# Patient Record
Sex: Male | Born: 1980
Health system: Southern US, Community
[De-identification: ages and names within clinical notes are randomized; demographics above are authoritative.]

## PROBLEM LIST (undated history)

## (undated) DIAGNOSIS — T7840XA Allergy, unspecified, initial encounter: Secondary | ICD-10-CM

## (undated) HISTORY — DX: Allergy, unspecified, initial encounter: T78.40XA

---

## 2016-11-05 ENCOUNTER — Telehealth: Payer: Self-pay | Admitting: *Deleted

## 2016-11-05 NOTE — Progress Notes (Signed)
Pre visit review using our clinic review tool, if applicable. No additional management support is needed unless otherwise documented below in the visit note. 

## 2016-11-05 NOTE — Progress Notes (Signed)
Joshua Baker is a 36 y.o. male here to Establish care and Physical.  History of Present Illness:   Chief Complaint  Patient presents with  . Establish Care  . Annual Exam    [employer] UMR    Acute Concerns: L hand pain -- a year and half ago, possible soccer injury, but he can't remember; hasn't hurt in a few weeks but is intermittent if pushed the wrong way; he is R handed; denies any ecchymosis, redness or popping/clicking sensation; states that the pain is at the base of his pinky to the top of his wrist  Chronic Issues: Seasonal allergies -- considers them to be well controlled, doesn't like to take something consistently but finds that if he takes 10mg  Claritin daily it prevents sinus infections, has used nasal sprays in the past but without success; he needs a new Epi-Pen prescription  Health Maintenance: Immunizations -- Tdap due today Colonoscopy -- to start at age 31, no family history Diet -- mostly eats at home, wife prepares well-balanced meals but sometimes eats out up to 3 times a week if his schedule requires, eats all food groups Caffeine intake -- 1/2 decaf and 1/2 caf in the morning, occasional sweet tea monthly, no energy drinks or soda Sleep habits -- good sleeper, 7.5 - 8 hours, does snore nightly but no concerns for sleep apnea Exercise -- no regular exercise, but does spend time with the kids in the yard Weight -- Weight: 188 lb (85.3 kg), 178-182 lb  Mood -- depends on the day, stressed at work  Depression screen [facility] 2/9 11/06/2016  Decreased Interest 0  Down, Depressed, Hopeless 0  PHQ - 2 Score 0   Other providers/specialists: Eye exam Dentist -- TBD  PMHx, SurgHx, SocialHx, Medications, and Allergies were reviewed in the Visit Navigator and updated as appropriate.  Current Medications:   Current Outpatient Prescriptions:  .  acetaminophen (TYLENOL) 325 MG tablet, Take 650 mg by mouth as needed., Disp: , Rfl:  .  ibuprofen (ADVIL) 200 MG tablet, Take 400  mg by mouth as needed., Disp: , Rfl:  .  loratadine (CLARITIN) 10 MG tablet, Take 10 mg by mouth daily., Disp: , Rfl:  .  EPINEPHrine (EPIPEN 2-PAK) 0.3 mg/0.3 mL IJ SOAJ injection, Inject 0.3 mLs (0.3 mg total) into the muscle once., Disp: 1 Device, Rfl: 1   Review of Systems:   Review of Systems  Constitutional: Negative.   HENT: Positive for congestion and sore throat.        Seasonal allergies, Sinus  Eyes: Positive for discharge.       Seasonal allergies  Respiratory: Negative.   Cardiovascular: Negative.   Gastrointestinal: Negative.   Genitourinary: Negative.   Musculoskeletal: Positive for joint pain.       Left hand, pinky finger to wrist area, x 1 year  Skin: Negative.   Neurological: Negative.   Endo/Heme/Allergies: Positive for environmental allergies.       Pollen and any stinging insects  Psychiatric/Behavioral: Negative.     Vitals:   Vitals:   11/06/16 0832  BP: 118/80  Pulse: 71  Temp: 98 F (36.7 C)  TempSrc: Oral  SpO2: 96%  Weight: 188 lb (85.3 kg)  Height: 6' (1.829 m)     Body mass index is 25.5 kg/m.  Physical Exam:   Physical Exam  Constitutional: He appears well-developed. He is cooperative.  Non-toxic appearance. He does not have a sickly appearance. He does not appear ill. No distress.  HENT:  Head: Normocephalic and atraumatic.  Right Ear: Tympanic membrane normal. Tympanic membrane is not erythematous, not retracted and not bulging.  Left Ear: Tympanic membrane normal. Tympanic membrane is not erythematous, not retracted and not bulging.  Nose: Nose normal.  Mouth/Throat: Uvula is midline and oropharynx is clear and moist.  Eyes: Conjunctivae, EOM and lids are normal. Pupils are equal, round, and reactive to light.  Neck: Trachea normal.  Cardiovascular: Normal rate, regular rhythm, S1 normal, S2 normal, normal heart sounds and normal pulses.   Pulmonary/Chest: Effort normal and breath sounds normal.  Abdominal: Normal appearance and  bowel sounds are normal. There is no tenderness.  Musculoskeletal:       Left hand: Normal. He exhibits normal range of motion, no tenderness, no bony tenderness, normal capillary refill, no deformity and no laceration. Normal sensation noted. Normal strength noted.  Lymphadenopathy:    He has no cervical adenopathy.  Neurological: He is alert.  Skin: Skin is warm, dry and intact.  Psychiatric: He has a normal mood and affect. His speech is normal and behavior is normal. Thought content normal.  Nursing note and vitals reviewed.     Assessment and Plan:    Shamal was seen today for establish care and annual exam.  Diagnoses and all orders for this visit:  Routine general medical examination at a health care facility Today patient counseled on age appropriate routine health concerns for screening and prevention, each reviewed and up to date or declined. Immunizations reviewed and up to date or declined. Labs ordered and reviewed. Risk factors for depression reviewed and negative. Hearing function and visual acuity are intact. ADLs screened and addressed as needed. Functional ability and level of safety reviewed and appropriate. Education, counseling and referrals performed based on assessed risks today. Patient provided with a copy of personalized plan for preventive services. Patient is agreeable to 1 time HIV screening. -     CBC with Differential/Platelet -     Lipid panel -     Comprehensive metabolic panel -     HIV antibody  Left hand pain Will obtain xray. If pain persists, we will have him see Dr. Teresa Coombs, patient is agreeable to this plan. -     DG Hand Complete Left; Future  Environmental Allergies Continue Claritin daily. Well controlled. Patient does need new prescription for Epi-Pen. Will order for patient. -     EPINEPHrine (EPIPEN 2-PAK) 0.3 mg/0.3 mL IJ SOAJ injection; Inject 0.3 mLs (0.3 mg total) into the muscle once.  . Reviewed expectations re: course of  current medical issues. . Discussed self-management of symptoms. . Outlined signs and symptoms indicating need for more acute intervention. . Patient verbalized understanding and all questions were answered. . See orders for this visit as documented in the electronic medical record. . Patient received an After-Visit Summary.   Inda Coke, PA-C

## 2016-11-05 NOTE — Telephone Encounter (Signed)
PreVisit Call attempted. Left VM. 

## 2016-11-06 ENCOUNTER — Encounter: Payer: Self-pay | Admitting: Physician Assistant

## 2016-11-06 ENCOUNTER — Ambulatory Visit (INDEPENDENT_AMBULATORY_CARE_PROVIDER_SITE_OTHER): Payer: Commercial Managed Care - PPO | Admitting: Physician Assistant

## 2016-11-06 ENCOUNTER — Ambulatory Visit (INDEPENDENT_AMBULATORY_CARE_PROVIDER_SITE_OTHER): Payer: Commercial Managed Care - PPO

## 2016-11-06 VITALS — BP 118/80 | HR 71 | Temp 98.0°F | Ht 72.0 in | Wt 188.0 lb

## 2016-11-06 DIAGNOSIS — Z23 Encounter for immunization: Secondary | ICD-10-CM

## 2016-11-06 DIAGNOSIS — Z9109 Other allergy status, other than to drugs and biological substances: Secondary | ICD-10-CM | POA: Diagnosis not present

## 2016-11-06 DIAGNOSIS — R51 Headache: Secondary | ICD-10-CM | POA: Diagnosis not present

## 2016-11-06 DIAGNOSIS — M79642 Pain in left hand: Secondary | ICD-10-CM | POA: Diagnosis not present

## 2016-11-06 DIAGNOSIS — Z0000 Encounter for general adult medical examination without abnormal findings: Secondary | ICD-10-CM | POA: Diagnosis not present

## 2016-11-06 LAB — CBC WITH DIFFERENTIAL/PLATELET
Basophils Absolute: 0.1 10*3/uL (ref 0.0–0.1)
Basophils Relative: 1 % (ref 0.0–3.0)
Eosinophils Absolute: 0.3 10*3/uL (ref 0.0–0.7)
Eosinophils Relative: 4.5 % (ref 0.0–5.0)
HCT: 46 % (ref 39.0–52.0)
Hemoglobin: 15.4 g/dL (ref 13.0–17.0)
LYMPHS ABS: 2.3 10*3/uL (ref 0.7–4.0)
Lymphocytes Relative: 30.7 % (ref 12.0–46.0)
MCHC: 33.4 g/dL (ref 30.0–36.0)
MCV: 83.9 fl (ref 78.0–100.0)
MONO ABS: 0.7 10*3/uL (ref 0.1–1.0)
Monocytes Relative: 8.9 % (ref 3.0–12.0)
NEUTROS PCT: 54.9 % (ref 43.0–77.0)
Neutro Abs: 4.2 10*3/uL (ref 1.4–7.7)
PLATELETS: 310 10*3/uL (ref 150.0–400.0)
RBC: 5.49 Mil/uL (ref 4.22–5.81)
RDW: 14 % (ref 11.5–15.5)
WBC: 7.6 10*3/uL (ref 4.0–10.5)

## 2016-11-06 LAB — COMPREHENSIVE METABOLIC PANEL
ALBUMIN: 4.5 g/dL (ref 3.5–5.2)
ALK PHOS: 51 U/L (ref 39–117)
ALT: 21 U/L (ref 0–53)
AST: 22 U/L (ref 0–37)
BILIRUBIN TOTAL: 0.5 mg/dL (ref 0.2–1.2)
BUN: 13 mg/dL (ref 6–23)
CO2: 31 mEq/L (ref 19–32)
CREATININE: 0.94 mg/dL (ref 0.40–1.50)
Calcium: 9.7 mg/dL (ref 8.4–10.5)
Chloride: 103 mEq/L (ref 96–112)
GFR: 96.67 mL/min (ref >60.00)
Glucose, Bld: 86 mg/dL (ref 70–99)
POTASSIUM: 4.1 meq/L (ref 3.5–5.1)
SODIUM: 139 meq/L (ref 135–145)
TOTAL PROTEIN: 7.7 g/dL (ref 6.0–8.3)

## 2016-11-06 LAB — LIPID PANEL
Cholesterol: 166 mg/dL (ref 0–200)
HDL: 41.8 mg/dL (ref >39.00)
LDL Cholesterol: 107 mg/dL — ABNORMAL HIGH (ref 0–99)
NonHDL: 123.87
Total CHOL/HDL Ratio: 4
Triglycerides: 83 mg/dL (ref 0.0–149.0)
VLDL: 16.6 mg/dL (ref 0.0–40.0)

## 2016-11-06 MED ORDER — EPINEPHRINE 0.3 MG/0.3ML IJ SOAJ: 0.3000 mg | Freq: Once | INTRAMUSCULAR | 1 refills | Status: AC

## 2016-11-06 NOTE — Patient Instructions (Addendum)
It was great meeting you today!  We will call you with your lab and xray results.   Health Maintenance, Male A healthy lifestyle and preventive care is important for your health and wellness. Ask your health care provider about what schedule of regular examinations is right for you. What should I know about weight and diet?  Eat a Healthy Diet  Eat plenty of vegetables, fruits, whole grains, low-fat dairy products, and lean protein.  Do not eat a lot of foods high in solid fats, added sugars, or salt. Maintain a Healthy Weight  Regular exercise can help you achieve or maintain a healthy weight. You should:  Do at least 150 minutes of exercise each week. The exercise should increase your heart rate and make you sweat (moderate-intensity exercise).  Do strength-training exercises at least twice a week. Watch Your Levels of Cholesterol and Blood Lipids  Have your blood tested for lipids and cholesterol every 5 years starting at 36 years of age. If you are at high risk for heart disease, you should start having your blood tested when you are 36 years old. You may need to have your cholesterol levels checked more often if:  Your lipid or cholesterol levels are high.  You are older than 36 years of age.  You are at high risk for heart disease. What should I know about cancer screening? Many types of cancers can be detected early and may often be prevented. Lung Cancer  You should be screened every year for lung cancer if:  You are a current smoker who has smoked for at least 30 years.  You are a former smoker who has quit within the past 15 years.  Talk to your health care provider about your screening options, when you should start screening, and how often you should be screened. Colorectal Cancer  Routine colorectal cancer screening usually begins at 36 years of age and should be repeated every 5-10 years until you are 36 years old. You may need to be screened more often if early  forms of precancerous polyps or small growths are found. Your health care provider may recommend screening at an earlier age if you have risk factors for colon cancer.  Your health care provider may recommend using home test kits to check for hidden blood in the stool.  A small camera at the end of a tube can be used to examine your colon (sigmoidoscopy or colonoscopy). This checks for the earliest forms of colorectal cancer. Prostate and Testicular Cancer  Depending on your age and overall health, your health care provider may do certain tests to screen for prostate and testicular cancer.  Talk to your health care provider about any symptoms or concerns you have about testicular or prostate cancer. Skin Cancer  Check your skin from head to toe regularly.  Tell your health care provider about any new moles or changes in moles, especially if:  There is a change in a mole's size, shape, or color.  You have a mole that is larger than a pencil eraser.  Always use sunscreen. Apply sunscreen liberally and repeat throughout the day.  Protect yourself by wearing long sleeves, pants, a wide-brimmed hat, and sunglasses when outside. What should I know about heart disease, diabetes, and high blood pressure?  If you are 64-50 years of age, have your blood pressure checked every 3-5 years. If you are 52 years of age or older, have your blood pressure checked every year. You should have your blood  pressure measured twice-once when you are at a hospital or clinic, and once when you are not at a hospital or clinic. Record the average of the two measurements. To check your blood pressure when you are not at a hospital or clinic, you can use:  An automated blood pressure machine at a pharmacy.  A home blood pressure monitor.  Talk to your health care provider about your target blood pressure.  If you are between 56-30 years old, ask your health care provider if you should take aspirin to prevent heart  disease.  Have regular diabetes screenings by checking your fasting blood sugar level.  If you are at a normal weight and have a low risk for diabetes, have this test once every three years after the age of 78.  If you are overweight and have a high risk for diabetes, consider being tested at a younger age or more often.  A one-time screening for abdominal aortic aneurysm (AAA) by ultrasound is recommended for men aged 54-75 years who are current or former smokers. What should I know about preventing infection? Hepatitis B  If you have a higher risk for hepatitis B, you should be screened for this virus. Talk with your health care provider to find out if you are at risk for hepatitis B infection. Hepatitis C  Blood testing is recommended for:  Everyone born from [birth year] through 1965.  Anyone with known risk factors for hepatitis C. Sexually Transmitted Diseases (STDs)  You should be screened each year for STDs including gonorrhea and chlamydia if:  You are sexually active and are younger than 36 years of age.  You are older than 36 years of age and your health care provider tells you that you are at risk for this type of infection.  Your sexual activity has changed since you were last screened and you are at an increased risk for chlamydia or gonorrhea. Ask your health care provider if you are at risk.  Talk with your health care provider about whether you are at high risk of being infected with HIV. Your health care provider may recommend a prescription medicine to help prevent HIV infection. What else can I do?  Schedule regular health, dental, and eye exams.  Stay current with your vaccines (immunizations).  Do not use any tobacco products, such as cigarettes, chewing tobacco, and e-cigarettes. If you need help quitting, ask your health care provider.  Limit alcohol intake to no more than 2 drinks per day. One drink equals 12 ounces of beer, 5 ounces of wine, or 1 ounces of hard  liquor.  Do not use street drugs.  Do not share needles.  Ask your health care provider for help if you need support or information about quitting drugs.  Tell your health care provider if you often feel depressed.  Tell your health care provider if you have ever been abused or do not feel safe at home. This information is not intended to replace advice given to you by your health care provider. Make sure you discuss any questions you have with your health care provider. Document Released: 01/19/2008 Document Revised: 03/21/2016 Document Reviewed: 04/26/2015 Elsevier Interactive Patient Education  2017 [employer].

## 2016-11-07 LAB — HIV ANTIBODY (ROUTINE TESTING W REFLEX): HIV: NONREACTIVE

## 2017-11-15 IMAGING — DX DG HAND COMPLETE 3+V*L*
3 series · 3 of 3 positions shown · non-contrast
Comparison: None in PACs

CLINICAL DATA: Lateral head pain for the past 18 months without
known injury.

EXAM:
LEFT HAND - COMPLETE 3+ VIEW

[hand pa]
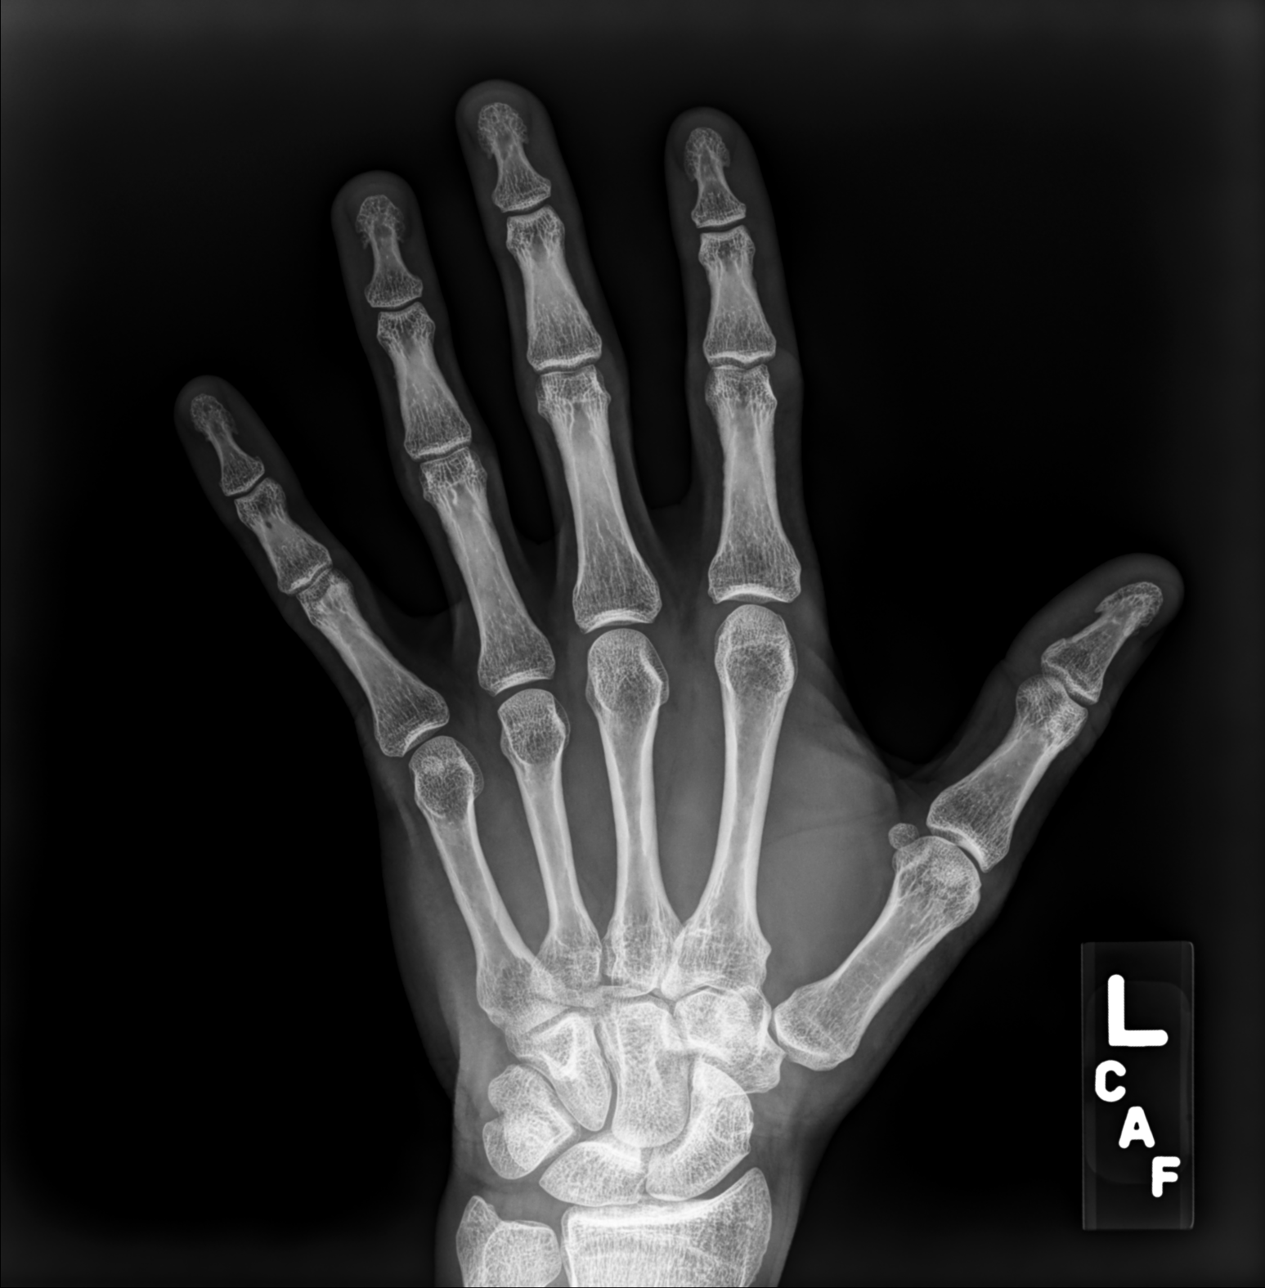

[hand oblique]
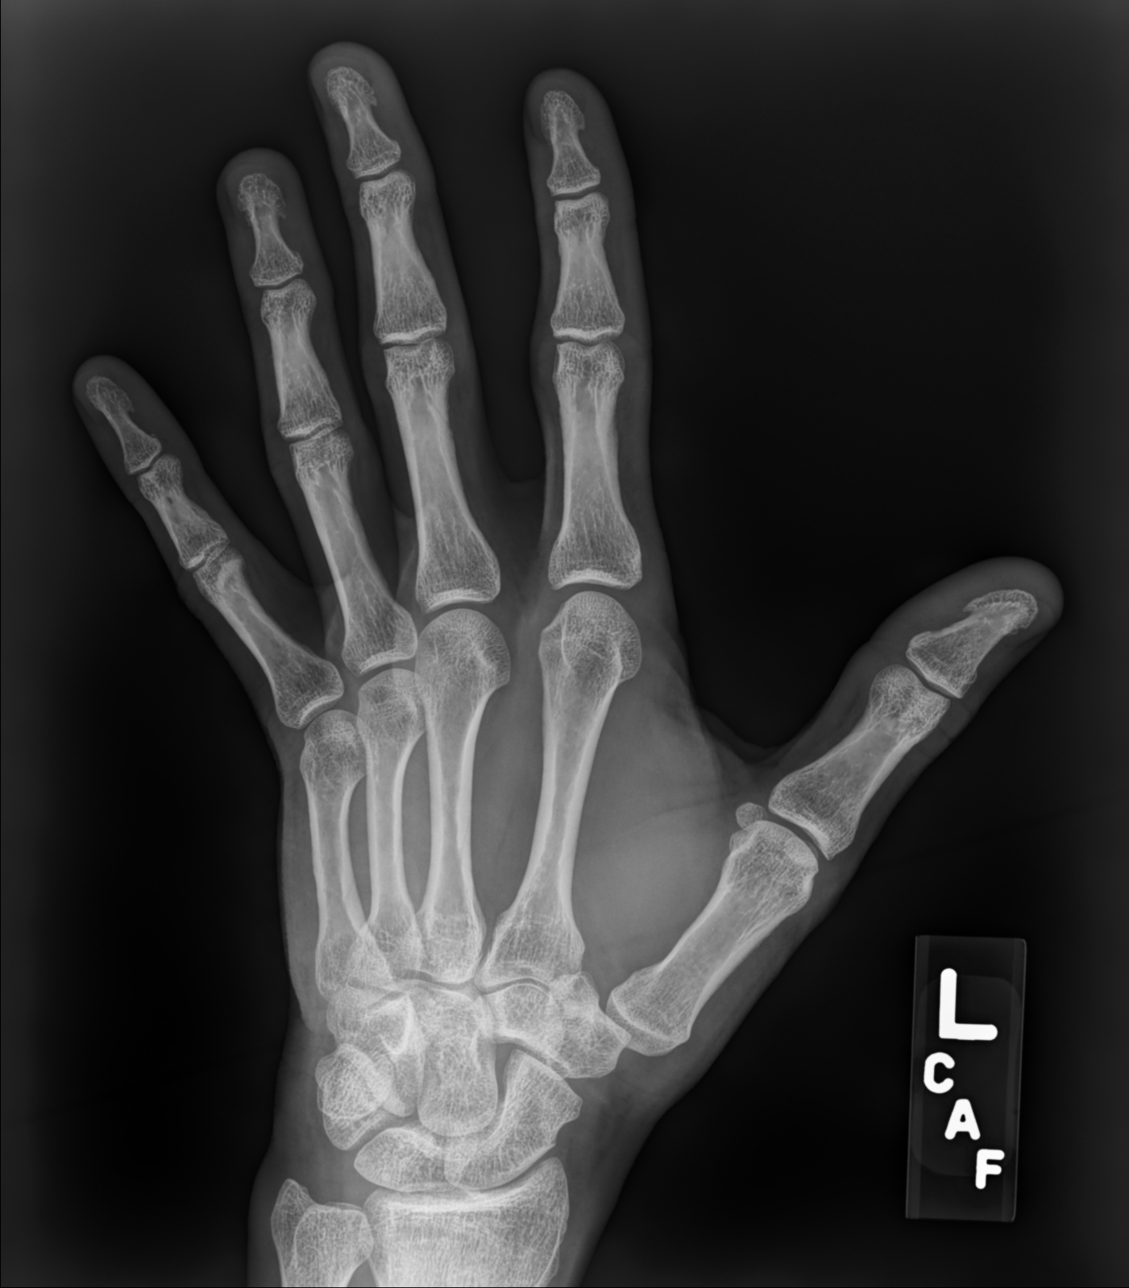

[hand lat]
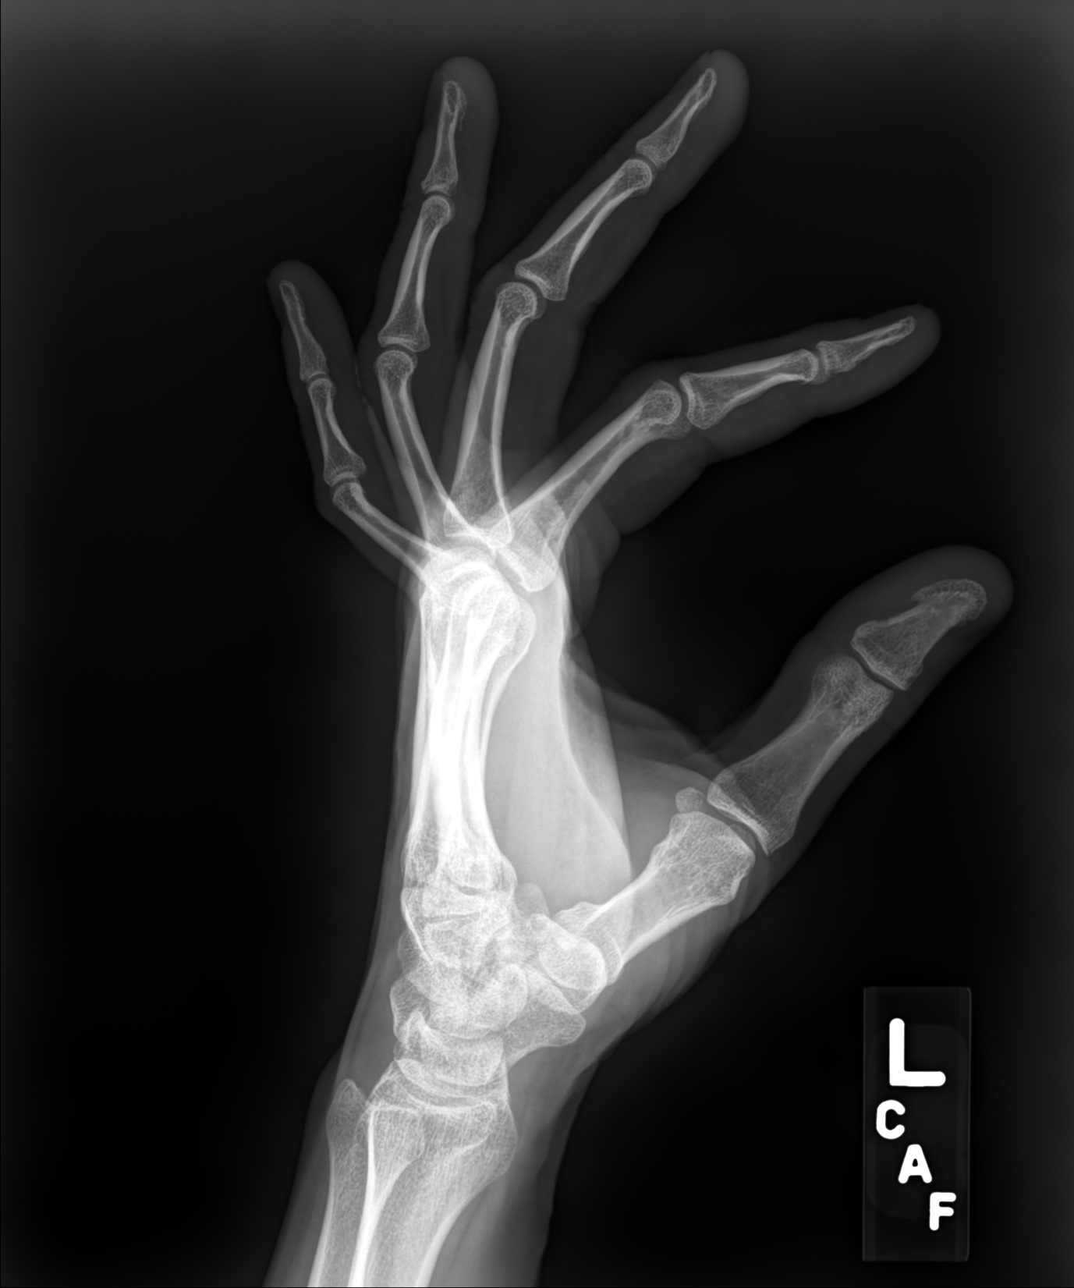

[3 of 3 positions shown; findings below may reference images not displayed]

FINDINGS: The bones of the left hand are subjectively adequately mineralized.
There is no lytic or blastic lesion. There is no acute or healing
fracture. The joint spaces are well maintained. The soft tissues are
unremarkable.
IMPRESSION: There is no acute or significant chronic bony abnormality of the
left hand.

## 2018-02-11 ENCOUNTER — Encounter: Payer: Self-pay | Admitting: Physician Assistant

## 2018-02-11 ENCOUNTER — Ambulatory Visit (INDEPENDENT_AMBULATORY_CARE_PROVIDER_SITE_OTHER): Payer: Commercial Managed Care - PPO | Admitting: Physician Assistant

## 2018-02-11 VITALS — BP 118/80 | HR 72 | Temp 98.5°F | Ht 72.0 in | Wt 193.0 lb

## 2018-02-11 DIAGNOSIS — Z9109 Other allergy status, other than to drugs and biological substances: Secondary | ICD-10-CM | POA: Diagnosis not present

## 2018-02-11 DIAGNOSIS — Z0000 Encounter for general adult medical examination without abnormal findings: Secondary | ICD-10-CM | POA: Diagnosis not present

## 2018-02-11 DIAGNOSIS — E663 Overweight: Secondary | ICD-10-CM | POA: Diagnosis not present

## 2018-02-11 DIAGNOSIS — Z13220 Encounter for screening for lipoid disorders: Secondary | ICD-10-CM | POA: Diagnosis not present

## 2018-02-11 DIAGNOSIS — Z136 Encounter for screening for cardiovascular disorders: Secondary | ICD-10-CM | POA: Diagnosis not present

## 2018-02-11 LAB — LIPID PANEL
CHOL/HDL RATIO: 4
Cholesterol: 176 mg/dL (ref 0–200)
HDL: 39.1 mg/dL (ref >39.00)
LDL CALC: 117 mg/dL — AB (ref 0–99)
NONHDL: 136.83
Triglycerides: 99 mg/dL (ref 0.0–149.0)
VLDL: 19.8 mg/dL (ref 0.0–40.0)

## 2018-02-11 LAB — COMPREHENSIVE METABOLIC PANEL
ALT: 25 U/L (ref 0–53)
AST: 19 U/L (ref 0–37)
Albumin: 4.2 g/dL (ref 3.5–5.2)
Alkaline Phosphatase: 50 U/L (ref 39–117)
BILIRUBIN TOTAL: 0.6 mg/dL (ref 0.2–1.2)
BUN: 13 mg/dL (ref 6–23)
CO2: 28 meq/L (ref 19–32)
CREATININE: 0.94 mg/dL (ref 0.40–1.50)
Calcium: 9.3 mg/dL (ref 8.4–10.5)
Chloride: 103 mEq/L (ref 96–112)
GFR: 95.99 mL/min (ref >60.00)
GLUCOSE: 92 mg/dL (ref 70–99)
Potassium: 4.2 mEq/L (ref 3.5–5.1)
Sodium: 137 mEq/L (ref 135–145)
TOTAL PROTEIN: 7 g/dL (ref 6.0–8.3)

## 2018-02-11 LAB — CBC WITH DIFFERENTIAL/PLATELET
BASOS PCT: 1 % (ref 0.0–3.0)
Basophils Absolute: 0.1 10*3/uL (ref 0.0–0.1)
EOS PCT: 2.9 % (ref 0.0–5.0)
Eosinophils Absolute: 0.2 10*3/uL (ref 0.0–0.7)
HCT: 45.8 % (ref 39.0–52.0)
Hemoglobin: 15.3 g/dL (ref 13.0–17.0)
Lymphocytes Relative: 27.2 % (ref 12.0–46.0)
Lymphs Abs: 2.2 10*3/uL (ref 0.7–4.0)
MCHC: 33.4 g/dL (ref 30.0–36.0)
MCV: 83.4 fl (ref 78.0–100.0)
MONOS PCT: 10.4 % (ref 3.0–12.0)
Monocytes Absolute: 0.8 10*3/uL (ref 0.1–1.0)
NEUTROS ABS: 4.7 10*3/uL (ref 1.4–7.7)
Neutrophils Relative %: 58.5 % (ref 43.0–77.0)
Platelets: 311 10*3/uL (ref 150.0–400.0)
RBC: 5.49 Mil/uL (ref 4.22–5.81)
RDW: 13.6 % (ref 11.5–15.5)
WBC: 8 10*3/uL (ref 4.0–10.5)

## 2018-02-11 NOTE — Patient Instructions (Addendum)
It was great to meet you!  We will contact you with your lab results.   Health Maintenance, Male A healthy lifestyle and preventive care is important for your health and wellness. Ask your health care provider about what schedule of regular examinations is right for you. What should I know about weight and diet? Eat a Healthy Diet  Eat plenty of vegetables, fruits, whole grains, low-fat dairy products, and lean protein.  Do not eat a lot of foods high in solid fats, added sugars, or salt.  Maintain a Healthy Weight Regular exercise can help you achieve or maintain a healthy weight. You should:  Do at least 150 minutes of exercise each week. The exercise should increase your heart rate and make you sweat (moderate-intensity exercise).  Do strength-training exercises at least twice a week.  Watch Your Levels of Cholesterol and Blood Lipids  Have your blood tested for lipids and cholesterol every 5 years starting at 37 years of age. If you are at high risk for heart disease, you should start having your blood tested when you are 37 years old. You may need to have your cholesterol levels checked more often if: ? Your lipid or cholesterol levels are high. ? You are older than 37 years of age. ? You are at high risk for heart disease.  What should I know about cancer screening? Many types of cancers can be detected early and may often be prevented. Lung Cancer  You should be screened every year for lung cancer if: ? You are a current smoker who has smoked for at least 30 years. ? You are a former smoker who has quit within the past 15 years.  Talk to your health care provider about your screening options, when you should start screening, and how often you should be screened.  Colorectal Cancer  Routine colorectal cancer screening usually begins at 37 years of age and should be repeated every 5-10 years until you are 37 years old. You may need to be screened more often if early forms  of precancerous polyps or small growths are found. Your health care provider may recommend screening at an earlier age if you have risk factors for colon cancer.  Your health care provider may recommend using home test kits to check for hidden blood in the stool.  A small camera at the end of a tube can be used to examine your colon (sigmoidoscopy or colonoscopy). This checks for the earliest forms of colorectal cancer.  Prostate and Testicular Cancer  Depending on your age and overall health, your health care provider may do certain tests to screen for prostate and testicular cancer.  Talk to your health care provider about any symptoms or concerns you have about testicular or prostate cancer.  Skin Cancer  Check your skin from head to toe regularly.  Tell your health care provider about any new moles or changes in moles, especially if: ? There is a change in a mole's size, shape, or color. ? You have a mole that is larger than a pencil eraser.  Always use sunscreen. Apply sunscreen liberally and repeat throughout the day.  Protect yourself by wearing long sleeves, pants, a wide-brimmed hat, and sunglasses when outside.  What should I know about heart disease, diabetes, and high blood pressure?  If you are 48-64 years of age, have your blood pressure checked every 3-5 years. If you are 59 years of age or older, have your blood pressure checked every year. You should  have your blood pressure measured twice-once when you are at a hospital or clinic, and once when you are not at a hospital or clinic. Record the average of the two measurements. To check your blood pressure when you are not at a hospital or clinic, you can use: ? An automated blood pressure machine at a pharmacy. ? A home blood pressure monitor.  Talk to your health care provider about your target blood pressure.  If you are between 52-60 years old, ask your health care provider if you should take aspirin to prevent heart  disease.  Have regular diabetes screenings by checking your fasting blood sugar level. ? If you are at a normal weight and have a low risk for diabetes, have this test once every three years after the age of 39. ? If you are overweight and have a high risk for diabetes, consider being tested at a younger age or more often.  A one-time screening for abdominal aortic aneurysm (AAA) by ultrasound is recommended for men aged 71-75 years who are current or former smokers. What should I know about preventing infection? Hepatitis B If you have a higher risk for hepatitis B, you should be screened for this virus. Talk with your health care provider to find out if you are at risk for hepatitis B infection. Hepatitis C Blood testing is recommended for:  Everyone born from [birth year] through 1965.  Anyone with known risk factors for hepatitis C.  Sexually Transmitted Diseases (STDs)  You should be screened each year for STDs including gonorrhea and chlamydia if: ? You are sexually active and are younger than 37 years of age. ? You are older than 37 years of age and your health care provider tells you that you are at risk for this type of infection. ? Your sexual activity has changed since you were last screened and you are at an increased risk for chlamydia or gonorrhea. Ask your health care provider if you are at risk.  Talk with your health care provider about whether you are at high risk of being infected with HIV. Your health care provider may recommend a prescription medicine to help prevent HIV infection.  What else can I do?  Schedule regular health, dental, and eye exams.  Stay current with your vaccines (immunizations).  Do not use any tobacco products, such as cigarettes, chewing tobacco, and e-cigarettes. If you need help quitting, ask your health care provider.  Limit alcohol intake to no more than 2 drinks per day. One drink equals 12 ounces of beer, 5 ounces of wine, or 1 ounces of  hard liquor.  Do not use street drugs.  Do not share needles.  Ask your health care provider for help if you need support or information about quitting drugs.  Tell your health care provider if you often feel depressed.  Tell your health care provider if you have ever been abused or do not feel safe at home. This information is not intended to replace advice given to you by your health care provider. Make sure you discuss any questions you have with your health care provider. Document Released: 01/19/2008 Document Revised: 03/21/2016 Document Reviewed: 04/26/2015 Elsevier Interactive Patient Education  [employer].

## 2018-02-11 NOTE — Progress Notes (Signed)
I acted as a Education administrator for [employer], PA-C Anselmo Pickler, LPN  Subjective:    Joshua Baker is a 37 y.o. male and is here for a comprehensive physical exam.  HPI  There are no preventive care reminders to display for this patient.  Acute Concerns: None  Chronic Issues: Seasonal allergies -- takes claritin daily  Health Maintenance: Immunizations -- UTD Colonoscopy -- N/A PSA -- N/A Diet -- wife prepares healthy meals for him, overall good varied diet Caffeine intake -- 1/2 caffeine and 1/2 decaf; avoids sodas Sleep habits -- great Exercise -- no regular exercise, active with children Weight -- Weight: 193 lb (87.5 kg)  Weight history Wt Readings from Last 10 Encounters:  02/11/18 193 lb (87.5 kg)  11/06/16 188 lb (85.3 kg)   Mood -- good Tobacco use -- cigar (very light) -- once q 3 years Alcohol use --- no more than 2 beers a day, only 2-3 days a week  Depression screen [facility] 2/9 02/11/2018  Decreased Interest 0  Down, Depressed, Hopeless 0  PHQ - 2 Score 0   Other providers/specialists: Therapist -- sees once q 2 weeks   PMHx, SurgHx, SocialHx, Medications, and Allergies were reviewed in the Visit Navigator and updated as appropriate.   History reviewed. No pertinent past medical history.  History reviewed. No pertinent surgical history.   Family History  Problem Relation Age of Onset  . Other Mother        borderline personality  . Cancer Father        Prostate  . Parkinson's disease Maternal Grandfather   . Colon cancer Neg Hx     Social History   Tobacco Use  . Smoking status: Light Tobacco Smoker    Types: Cigars  . Smokeless tobacco: Never Used  . Tobacco comment: rarely  Substance Use Topics  . Alcohol use: Yes    Alcohol/week: 3.0 oz    Types: 5 Cans of beer per week  . Drug use: No    Review of Systems:   Review of Systems  Constitutional: Negative.  Negative for chills, fever, malaise/fatigue and weight loss.  HENT: Negative.   Negative for hearing loss, sinus pain and sore throat.   Eyes: Negative.  Negative for blurred vision.  Respiratory: Negative.  Negative for cough and shortness of breath.   Cardiovascular: Negative.  Negative for chest pain, palpitations and leg swelling.  Gastrointestinal: Negative.  Negative for abdominal pain, constipation, diarrhea, heartburn, nausea and vomiting.  Genitourinary: Negative.  Negative for dysuria, frequency and urgency.  Musculoskeletal: Negative.  Negative for back pain, myalgias and neck pain.  Skin: Negative.  Negative for itching and rash.  Neurological: Negative.  Negative for dizziness, tingling, seizures, loss of consciousness and headaches.  Endo/Heme/Allergies: Negative.  Negative for polydipsia.  Psychiatric/Behavioral: Negative.  Negative for depression. The patient is not nervous/anxious.     Objective:   Vitals:   02/11/18 0800  BP: 118/80  Pulse: 72  Temp: 98.5 F (36.9 C)  SpO2: 97%   Body mass index is 26.18 kg/m.  General Appearance:  Alert, cooperative, no distress, appears stated age  Head:  Normocephalic, without obvious abnormality, atraumatic  Eyes:  PERRL, conjunctiva/corneas clear, EOM's intact, fundi benign, both eyes       Ears:  Normal TM's and external ear canals, both ears  Nose: Nares normal, septum midline, mucosa normal, no drainage    or sinus tenderness  Throat: Lips, mucosa, and tongue normal; teeth and gums normal  Neck:  Supple, symmetrical, trachea midline, no adenopathy; thyroid:  No enlargement/tenderness/nodules; no carotit bruit or JVD  Back:   Symmetric, no curvature, ROM normal, no CVA tenderness  Lungs:   Clear to auscultation bilaterally, respirations unlabored  Chest wall:  No tenderness or deformity  Heart:  Regular rate and rhythm, S1 and S2 normal, no murmur, rub   or gallop  Abdomen:   Soft, non-tender, bowel sounds active all four quadrants, no masses, no organomegaly  Extremities: Extremities normal,  atraumatic, no cyanosis or edema  Prostate: Not done.   Skin: Skin color, texture, turgor normal, no rashes or lesions  Lymph nodes: Cervical, supraclavicular, and axillary nodes normal  Neurologic: CNII-XII grossly intact. Normal strength, sensation and reflexes throughout    Assessment/Plan:   Angeldejesus was seen today for annual exam.  Problem List Items Addressed This Visit      Other   Environmental allergies    Well controlled. Declined any use of Epi-Pen. Denies need for new prescription.      Overweight    Slight 5 lb weight gain x 1 year. Work on diet and add in exercise as schedule allows.      Relevant Orders   CBC with Differential/Platelet   Comprehensive metabolic panel    Other Visit Diagnoses    Routine physical examination    -  Primary Today patient counseled on age appropriate routine health concerns for screening and prevention, each reviewed and up to date or declined. Immunizations reviewed and up to date or declined. Labs ordered and reviewed. Risk factors for depression reviewed and negative. Hearing function and visual acuity are intact. ADLs screened and addressed as needed. Functional ability and level of safety reviewed and appropriate. Education, counseling and referrals performed based on assessed risks today. Patient provided with a copy of personalized plan for preventive services.    Encounter for lipid screening for cardiovascular disease       Relevant Orders   Lipid panel      Well Adult Exam: Labs ordered: Yes. Patient counseling was done. See below for items discussed. Discussed the patient's BMI.  The BMI BMI is not in the acceptable range; BMI management plan is completed Follow up as needed for acute illness.  Patient Counseling: [x]   Nutrition: Stressed importance of moderation in sodium/caffeine intake, saturated fat and cholesterol, caloric balance, sufficient intake of fresh fruits, vegetables, and fiber.  [x]   Stressed the importance of  regular exercise.   []   Substance Abuse: Discussed cessation/primary prevention of tobacco, alcohol, or other drug use; driving or other dangerous activities under the influence; availability of treatment for abuse.   [x]   Injury prevention: Discussed safety belts, safety helmets, smoke detector, smoking near bedding or upholstery.   []   Sexuality: Discussed sexually transmitted diseases, partner selection, use of condoms, avoidance of unintended pregnancy  and contraceptive alternatives.   [x]   Dental health: Discussed importance of regular tooth brushing, flossing, and dental visits.  [x]   Health maintenance and immunizations reviewed. Please refer to Health maintenance section.    CMA or LPN served as scribe during this visit. History, Physical, and Plan performed by medical provider. Documentation and orders reviewed and attested to.   Inda Coke, PA-C [address]

## 2018-02-11 NOTE — Assessment & Plan Note (Signed)
Well controlled. Declined any use of Epi-Pen. Denies need for new prescription.

## 2018-02-11 NOTE — Assessment & Plan Note (Signed)
Slight 5 lb weight gain x 1 year. Work on diet and add in exercise as schedule allows.

## 2018-10-24 ENCOUNTER — Telehealth: Payer: Commercial Managed Care - PPO | Admitting: Family

## 2018-10-24 ENCOUNTER — Ambulatory Visit: Payer: Self-pay | Admitting: Physician Assistant

## 2018-10-24 DIAGNOSIS — R5381 Other malaise: Secondary | ICD-10-CM

## 2018-10-24 NOTE — Telephone Encounter (Signed)
I had a fever on Monday until Wed.   No Tylenol.   I've  been in my own room.  No exposure.   No shortness of breath or cough.     See notes below.    Reason for Disposition . [1] Caller requesting NON-URGENT health information AND [2] PCP's office is the best resource  Answer Assessment - Initial Assessment Questions 1. REASON FOR CALL or QUESTION: "What is your reason for calling today?" or "How can I best help you?" or "What question do you have that I can help answer?"     The pt called in stating he had a fever from Monday to Wednesday.   He has not been taking anything for fever.   He has been staying in his bedroom.   Is he cleared to come out of his bedroom.  I recommended he do an E visit.   I gave him the [email or web address] web site.   He is going to do this.   He thanked me for my help.  Protocols used: INFORMATION ONLY CALL-A-AH

## 2018-10-24 NOTE — Progress Notes (Signed)
Greater than 5 minutes, yet less than 10 minutes of time have been spent researching, coordinating, and implementing care for this patient today.  Thank you for the details you included in the comment boxes. Those details are very helpful in determining the best course of treatment for you and help [place] to provide the best care.  Thankfully, body aches are not part of it and you don't have high fever or severe shortness of breath and coughing. You are low-risk for this.   E-Visit for Corona Virus Screening Based on your current symptoms, it seems unlikely that your symptoms are related to the Amanda virus.   Coronavirus disease 2019 (COVID-19) is a respiratory illness that can spread from person to person. The virus that causes COVID-19 is a new virus that was first identified in the country of [place] but is now found in multiple other countries and has spread to the [place].  Symptoms associated with the virus are mild to severe fever, cough, and shortness of breath. There is currently no vaccine to protect against COVID-19, and there is no specific antiviral treatment for the virus.  It is vitally important that if you feel that you have an infection such as this virus or any other virus that you stay home and away from places where you may spread it to others.  Currently, not all patients are being tested. If the symptoms are mild and there is not a known exposure, performing the test is not indicated.  Reduce your risk of any infection by using the same precautions used for avoiding the common cold or flu:  Marland Kitchen Wash your hands often with soap and warm water for at least 20 seconds.  If soap and water are not readily available, use an alcohol-based hand sanitizer with at least 60% alcohol.  . If coughing or sneezing, cover your mouth and nose by coughing or sneezing into the elbow areas of your shirt or coat, into a tissue or into your sleeve (not your hands). . Avoid shaking hands with others  and consider head nods or verbal greetings only. . Avoid touching your eyes, nose, or mouth with unwashed hands.  . Avoid close contact with people who are sick. . Avoid places or events with large numbers of people in one location, like concerts or sporting events. . Carefully consider travel plans you have or are making. . If you are planning any travel outside or inside the [place], visit the CDC's Travelers' Health webpage for the latest health notices. . If you have some symptoms but not all symptoms, continue to monitor at home and seek medical attention if your symptoms worsen. . If you are having a medical emergency, call 911.  HOME CARE . Only take medications as instructed by your medical team. . Drink plenty of fluids and get plenty of rest. . A steam or ultrasonic humidifier can help if you have congestion.   GET HELP RIGHT AWAY IF: . You develop worsening fever. . You become short of breath . You cough up blood. . Your symptoms become more severe MAKE SURE YOU   Understand these instructions.  Will watch your condition.  Will get help right away if you are not doing well or get worse.  Your e-visit answers were reviewed by a board certified advanced clinical practitioner to complete your personal care plan.  Depending on the condition, your plan could have included both over the counter or prescription medications.  If there is a problem please  reply once you have received a response from your provider. Your safety is important to [place].  If you have drug allergies check your prescription carefully.    You can use MyChart to ask questions about today's visit, request a non-urgent call back, or ask for a work or school excuse for 24 hours related to this e-Visit. If it has been greater than 24 hours you will need to follow up with your provider, or enter a new e-Visit to address those concerns. You will get an e-mail in the next two days asking about your experience.  I hope that  your e-visit has been valuable and will speed your recovery. Thank you for using e-visits.

## 2019-01-23 ENCOUNTER — Telehealth: Payer: Self-pay | Admitting: *Deleted

## 2019-01-23 ENCOUNTER — Ambulatory Visit (INDEPENDENT_AMBULATORY_CARE_PROVIDER_SITE_OTHER): Payer: Commercial Managed Care - PPO | Admitting: Family Medicine

## 2019-01-23 ENCOUNTER — Encounter: Payer: Self-pay | Admitting: Family Medicine

## 2019-01-23 DIAGNOSIS — Z20822 Contact with and (suspected) exposure to covid-19: Secondary | ICD-10-CM

## 2019-01-23 DIAGNOSIS — J029 Acute pharyngitis, unspecified: Secondary | ICD-10-CM

## 2019-01-23 DIAGNOSIS — Z20828 Contact with and (suspected) exposure to other viral communicable diseases: Secondary | ICD-10-CM | POA: Diagnosis not present

## 2019-01-23 DIAGNOSIS — R6889 Other general symptoms and signs: Secondary | ICD-10-CM | POA: Diagnosis not present

## 2019-01-23 NOTE — Telephone Encounter (Signed)
Pt called to check on his appointment to be tested. He was scheduled for Monday at the [facility] at 9:30. Advised that this is a drive thru testy site and to stay in car with windows rolled up until read to be tested. Pt voiced understanding.

## 2019-01-23 NOTE — Progress Notes (Signed)
    Chief Complaint:  Joshua Baker is a 38 y.o. male who presents today for a virtual office visit with a chief complaint of sore throat.   Assessment/Plan:  Sore Throat Concern for COVID given recent known sick contacts.  Will send for testing.  Does not have any red flags or signs of respiratory distress.  Continue supportive care with good oral hydration and Tylenol as needed.  Discussed reasons to return to care and seek emergent care.    Subjective:  HPI:  Sore Throat Started 2 days ago. Was recently around 3 other individuals who tested positive for COVID about a week ago. Associated symptoms include nasal drainage. . No shortness of breath. No treatments tried. Lives near elderly in-laws and is in frequent contact with them.   ROS: Per HPI  PMH: He reports that he has been smoking cigars. He has never used smokeless tobacco. He reports current alcohol use of about 5.0 standard drinks of alcohol per week. He reports that he does not use drugs.      Objective/Observations  Physical Exam: Gen: NAD, resting comfortably Pulm: Normal work of breathing Neuro: Grossly normal, moves all extremities Psych: Normal affect and thought content  Virtual Visit via Video   I connected with Joshua Baker on 01/23/19 at  2:20 PM EDT by a video enabled telemedicine application and verified that I am speaking with the correct person using two identifiers. I discussed the limitations of evaluation and management by telemedicine and the availability of in person appointments. The patient expressed understanding and agreed to proceed.   Patient location: Home Provider location: [place] participating in the virtual visit: Myself and Patient     Algis Greenhouse. Jerline Pain, MD 01/23/2019 2:32 PM

## 2019-01-23 NOTE — Telephone Encounter (Signed)
-----   Message from [place], [place] sent at 01/23/2019  3:41 PM EDT ----- Regarding: Covid Testing  DOB May 27, 1981 MRN: [id number] Joshua Baker  [id number]    Symptoms of Covid 19

## 2019-01-26 ENCOUNTER — Other Ambulatory Visit: Payer: Commercial Managed Care - PPO

## 2019-01-26 DIAGNOSIS — Z20822 Contact with and (suspected) exposure to covid-19: Secondary | ICD-10-CM

## 2019-01-31 LAB — NOVEL CORONAVIRUS, NAA: SARS-CoV-2, NAA: NOT DETECTED

## 2019-02-13 ENCOUNTER — Encounter: Payer: Commercial Managed Care - PPO | Admitting: Physician Assistant

## 2019-02-13 ENCOUNTER — Other Ambulatory Visit: Payer: Self-pay

## 2019-02-15 NOTE — Progress Notes (Signed)
Error

## 2019-02-16 ENCOUNTER — Other Ambulatory Visit: Payer: Self-pay

## 2019-02-16 ENCOUNTER — Encounter: Payer: Self-pay | Admitting: Physician Assistant

## 2019-02-16 ENCOUNTER — Ambulatory Visit (INDEPENDENT_AMBULATORY_CARE_PROVIDER_SITE_OTHER): Payer: Commercial Managed Care - PPO | Admitting: Physician Assistant

## 2019-02-16 VITALS — BP 118/84 | HR 69 | Temp 98.8°F | Ht 73.0 in | Wt 196.5 lb

## 2019-02-16 DIAGNOSIS — Z136 Encounter for screening for cardiovascular disorders: Secondary | ICD-10-CM

## 2019-02-16 DIAGNOSIS — Z0000 Encounter for general adult medical examination without abnormal findings: Secondary | ICD-10-CM

## 2019-02-16 DIAGNOSIS — Z13220 Encounter for screening for lipoid disorders: Secondary | ICD-10-CM | POA: Diagnosis not present

## 2019-02-16 DIAGNOSIS — E663 Overweight: Secondary | ICD-10-CM | POA: Diagnosis not present

## 2019-02-16 LAB — CBC WITH DIFFERENTIAL/PLATELET
Basophils Absolute: 0.1 10*3/uL (ref 0.0–0.1)
Basophils Relative: 0.8 % (ref 0.0–3.0)
Eosinophils Absolute: 0.2 10*3/uL (ref 0.0–0.7)
Eosinophils Relative: 2.3 % (ref 0.0–5.0)
HCT: 45.6 % (ref 39.0–52.0)
Hemoglobin: 15.2 g/dL (ref 13.0–17.0)
Lymphocytes Relative: 27.5 % (ref 12.0–46.0)
Lymphs Abs: 2.1 10*3/uL (ref 0.7–4.0)
MCHC: 33.4 g/dL (ref 30.0–36.0)
MCV: 84.5 fl (ref 78.0–100.0)
Monocytes Absolute: 0.7 10*3/uL (ref 0.1–1.0)
Monocytes Relative: 9.3 % (ref 3.0–12.0)
Neutro Abs: 4.6 10*3/uL (ref 1.4–7.7)
Neutrophils Relative %: 60.1 % (ref 43.0–77.0)
Platelets: 302 10*3/uL (ref 150.0–400.0)
RBC: 5.4 Mil/uL (ref 4.22–5.81)
RDW: 13.5 % (ref 11.5–15.5)
WBC: 7.7 10*3/uL (ref 4.0–10.5)

## 2019-02-16 LAB — LIPID PANEL
Cholesterol: 166 mg/dL (ref 0–200)
HDL: 34.4 mg/dL — ABNORMAL LOW (ref >39.00)
LDL Cholesterol: 110 mg/dL — ABNORMAL HIGH (ref 0–99)
NonHDL: 131.94
Total CHOL/HDL Ratio: 5
Triglycerides: 111 mg/dL (ref 0.0–149.0)
VLDL: 22.2 mg/dL (ref 0.0–40.0)

## 2019-02-16 LAB — COMPREHENSIVE METABOLIC PANEL
ALT: 25 U/L (ref 0–53)
AST: 18 U/L (ref 0–37)
Albumin: 4.5 g/dL (ref 3.5–5.2)
Alkaline Phosphatase: 56 U/L (ref 39–117)
BUN: 14 mg/dL (ref 6–23)
CO2: 27 mEq/L (ref 19–32)
Calcium: 9.3 mg/dL (ref 8.4–10.5)
Chloride: 106 mEq/L (ref 96–112)
Creatinine, Ser: 0.96 mg/dL (ref 0.40–1.50)
GFR: 87.66 mL/min (ref >60.00)
Glucose, Bld: 80 mg/dL (ref 70–99)
Potassium: 4.5 mEq/L (ref 3.5–5.1)
Sodium: 140 mEq/L (ref 135–145)
Total Bilirubin: 0.6 mg/dL (ref 0.2–1.2)
Total Protein: 7.2 g/dL (ref 6.0–8.3)

## 2019-02-16 NOTE — Patient Instructions (Signed)
It was great to see you!  *Please bring any forms you need completed by the office for [place] to complete  Please go to the lab for blood work.   Our office will call you with your results unless you have chosen to receive results via MyChart.  If your blood work is normal we will follow-up each year for physicals and as scheduled for chronic medical problems.  If anything is abnormal we will treat accordingly and get you in for a follow-up.  Take care,  [facility] Maintenance, Male Adopting a healthy lifestyle and getting preventive care are important in promoting health and wellness. Ask your health care provider about:  The right schedule for you to have regular tests and exams.  Things you can do on your own to prevent diseases and keep yourself healthy. What should I know about diet, weight, and exercise? Eat a healthy diet   Eat a diet that includes plenty of vegetables, fruits, low-fat dairy products, and lean protein.  Do not eat a lot of foods that are high in solid fats, added sugars, or sodium. Maintain a healthy weight Body mass index (BMI) is a measurement that can be used to identify possible weight problems. It estimates body fat based on height and weight. Your health care provider can help determine your BMI and help you achieve or maintain a healthy weight. Get regular exercise Get regular exercise. This is one of the most important things you can do for your health. Most adults should:  Exercise for at least 150 minutes each week. The exercise should increase your heart rate and make you sweat (moderate-intensity exercise).  Do strengthening exercises at least twice a week. This is in addition to the moderate-intensity exercise.  Spend less time sitting. Even light physical activity can be beneficial. Watch cholesterol and blood lipids Have your blood tested for lipids and cholesterol at 38 years of age, then have this test every 5 years. You may need  to have your cholesterol levels checked more often if:  Your lipid or cholesterol levels are high.  You are older than 38 years of age.  You are at high risk for heart disease. What should I know about cancer screening? Many types of cancers can be detected early and may often be prevented. Depending on your health history and family history, you may need to have cancer screening at various ages. This may include screening for:  Colorectal cancer.  Prostate cancer.  Skin cancer.  Lung cancer. What should I know about heart disease, diabetes, and high blood pressure? Blood pressure and heart disease  High blood pressure causes heart disease and increases the risk of stroke. This is more likely to develop in people who have high blood pressure readings, are of African descent, or are overweight.  Talk with your health care provider about your target blood pressure readings.  Have your blood pressure checked: ? Every 3-5 years if you are 63-47 years of age. ? Every year if you are 36 years old or older.  If you are between the ages of 24 and 46 and are a current or former smoker, ask your health care provider if you should have a one-time screening for abdominal aortic aneurysm (AAA). Diabetes Have regular diabetes screenings. This checks your fasting blood sugar level. Have the screening done:  Once every three years after age 9 if you are at a normal weight and have a low risk for diabetes.  More  often and at a younger age if you are overweight or have a high risk for diabetes. What should I know about preventing infection? Hepatitis B If you have a higher risk for hepatitis B, you should be screened for this virus. Talk with your health care provider to find out if you are at risk for hepatitis B infection. Hepatitis C Blood testing is recommended for:  Everyone born from [birth year] through 1965.  Anyone with known risk factors for hepatitis C. Sexually transmitted infections  (STIs)  You should be screened each year for STIs, including gonorrhea and chlamydia, if: ? You are sexually active and are younger than 38 years of age. ? You are older than 38 years of age and your health care provider tells you that you are at risk for this type of infection. ? Your sexual activity has changed since you were last screened, and you are at increased risk for chlamydia or gonorrhea. Ask your health care provider if you are at risk.  Ask your health care provider about whether you are at high risk for HIV. Your health care provider may recommend a prescription medicine to help prevent HIV infection. If you choose to take medicine to prevent HIV, you should first get tested for HIV. You should then be tested every 3 months for as long as you are taking the medicine. Follow these instructions at home: Lifestyle  Do not use any products that contain nicotine or tobacco, such as cigarettes, e-cigarettes, and chewing tobacco. If you need help quitting, ask your health care provider.  Do not use street drugs.  Do not share needles.  Ask your health care provider for help if you need support or information about quitting drugs. Alcohol use  Do not drink alcohol if your health care provider tells you not to drink.  If you drink alcohol: ? Limit how much you have to 0-2 drinks a day. ? Be aware of how much alcohol is in your drink. In the [place] one drink equals one 12 oz bottle of beer (355 mL), one 5 oz glass of wine (148 mL), or one 1 oz glass of hard liquor (44 mL). General instructions  Schedule regular health, dental, and eye exams.  Stay current with your vaccines.  Tell your health care provider if: ? You often feel depressed. ? You have ever been abused or do not feel safe at home. Summary  Adopting a healthy lifestyle and getting preventive care are important in promoting health and wellness.  Follow your health care provider's instructions about healthy diet,  exercising, and getting tested or screened for diseases.  Follow your health care provider's instructions on monitoring your cholesterol and blood pressure. This information is not intended to replace advice given to you by your health care provider. Make sure you discuss any questions you have with your health care provider. Document Released: 01/19/2008 Document Revised: 07/16/2018 Document Reviewed: 07/16/2018 Elsevier Patient Education  2020 [employer].

## 2019-02-16 NOTE — Progress Notes (Signed)
Subjective:    Joshua Baker is a 38 y.o. male and is here for a comprehensive physical exam.  HPI  There are no preventive care reminders to display for this patient.  Acute Concerns: None  Chronic Issues: None  Health Maintenance: Immunizations -- UTD Colonoscopy -- n/a PSA -- n/a Diet -- eating more at home, now that he is working from home he is snacking throughout the day Sleep habits -- good Exercise -- swims at family pool occasionally Weight -- Weight: 196 lb 8 oz (89.1 kg)  Weight history Wt Readings from Last 10 Encounters:  02/16/19 196 lb 8 oz (89.1 kg)  02/11/18 193 lb (87.5 kg)  11/06/16 188 lb (85.3 kg)  Mood -- good; stressed with work Tobacco use -- n/a Alcohol use --- 2-3 drinks per week  Depression screen [facility] 2/9 02/16/2019  Decreased Interest 0  Down, Depressed, Hopeless 0  PHQ - 2 Score 0     Other providers/specialists: Patient Care Team: Inda Coke, [place] as PCP - General (Physician Assistant)   PMHx, SurgHx, SocialHx, Medications, and Allergies were reviewed in the Visit Navigator and updated as appropriate.   History reviewed. No pertinent past medical history.  History reviewed. No pertinent surgical history.   Family History  Problem Relation Age of Onset   Other Mother        borderline personality   Cancer Father        Prostate   Parkinson's disease Maternal Grandfather    Colon cancer Neg Hx     Social History   Tobacco Use   Smoking status: Light Tobacco Smoker    Types: Cigars   Smokeless tobacco: Never Used   Tobacco comment: rarely  Substance Use Topics   Alcohol use: Yes    Alcohol/week: 5.0 standard drinks    Types: 5 Cans of beer per week   Drug use: No    Review of Systems:   ROS  Objective:   Vitals:   02/16/19 0846  BP: 118/84  Pulse: 69  Temp: 98.8 F (37.1 C)  SpO2: 96%   Body mass index is 25.93 kg/m.  General Appearance:  Alert, cooperative, no distress, appears stated  age  Head:  Normocephalic, without obvious abnormality, atraumatic  Eyes:  PERRL, conjunctiva/corneas clear, EOM's intact, fundi benign, both eyes       Ears:  Normal TM's and external ear canals, both ears  Nose: Nares normal, septum midline, mucosa normal, no drainage    or sinus tenderness  Throat: Lips, mucosa, and tongue normal; teeth and gums normal  Neck: Supple, symmetrical, trachea midline, no adenopathy; thyroid:  No enlargement/tenderness/nodules; no carotit bruit or JVD  Back:   Symmetric, no curvature, ROM normal, no CVA tenderness  Lungs:   Clear to auscultation bilaterally, respirations unlabored  Chest wall:  No tenderness or deformity  Heart:  Regular rate and rhythm, S1 and S2 normal, no murmur, rub   or gallop  Abdomen:   Soft, non-tender, bowel sounds active all four quadrants, no masses, no organomegaly  Extremities: Extremities normal, atraumatic, no cyanosis or edema  Prostate: Not done.   Skin: Skin color, texture, turgor normal, no rashes or lesions  Lymph nodes: Cervical, supraclavicular, and axillary nodes normal  Neurologic: CNII-XII grossly intact. Normal strength, sensation and reflexes throughout    Results for orders placed or performed in visit on 01/26/19  Novel Coronavirus, NAA ([facility])  Result Value Ref Range   SARS-CoV-2, NAA Not Detected Not Detected  Assessment/Plan:   Omri was seen today for annual exam.  Diagnoses and all orders for this visit:  Routine physical examination Today patient counseled on age appropriate routine health concerns for screening and prevention, each reviewed and up to date or declined. Immunizations reviewed and up to date or declined. Labs ordered and reviewed. Risk factors for depression reviewed and negative. Hearing function and visual acuity are intact. ADLs screened and addressed as needed. Functional ability and level of safety reviewed and appropriate. Education, counseling and referrals performed based on  assessed risks today. Patient provided with a copy of personalized plan for preventive services.  Overweight Continue to work on diet and exercise as able. -     CBC with Differential/Platelet -     Comprehensive metabolic panel  Encounter for lipid screening for cardiovascular disease -     Lipid panel    Well Adult Exam: Labs ordered: Yes. Patient counseling was done. See below for items discussed. Discussed the patient's BMI.  The BMI is not in the acceptable range; BMI management plan is completed Follow up next physical in 1 year.  Patient Counseling: [x]   Nutrition: Stressed importance of moderation in sodium/caffeine intake, saturated fat and cholesterol, caloric balance, sufficient intake of fresh fruits, vegetables, and fiber.  [x]   Stressed the importance of regular exercise.   []   Substance Abuse: Discussed cessation/primary prevention of tobacco, alcohol, or other drug use; driving or other dangerous activities under the influence; availability of treatment for abuse.   [x]   Injury prevention: Discussed safety belts, safety helmets, smoke detector, smoking near bedding or upholstery.   []   Sexuality: Discussed sexually transmitted diseases, partner selection, use of condoms, avoidance of unintended pregnancy  and contraceptive alternatives.   [x]   Dental health: Discussed importance of regular tooth brushing, flossing, and dental visits.  [x]   Health maintenance and immunizations reviewed. Please refer to Health maintenance section.    Inda Coke, PA-C [place]

## 2019-06-28 ENCOUNTER — Telehealth: Payer: Commercial Managed Care - PPO | Admitting: Family

## 2019-06-28 DIAGNOSIS — Z20822 Contact with and (suspected) exposure to covid-19: Secondary | ICD-10-CM

## 2019-06-28 DIAGNOSIS — Z20828 Contact with and (suspected) exposure to other viral communicable diseases: Secondary | ICD-10-CM

## 2019-06-28 MED ORDER — BENZONATATE 100 MG PO CAPS: 100.0000 mg | ORAL_CAPSULE | Freq: Three times a day (TID) | ORAL | 0 refills | Status: DC | PRN

## 2019-06-28 NOTE — Progress Notes (Signed)
E-Visit for Corona Virus Screening   Your current symptoms could be consistent with the coronavirus.  Many health care providers can now test patients at their office but not all are.  [place] has multiple testing sites. For information on our COVID testing locations and hours go to [email or web address]  Please quarantine yourself while awaiting your test results.  We are enrolling you in our [place] for [place] . Daily you will receive a questionnaire within the [place] website. Our COVID 19 response team willl be monitoriing your responses daily. Please continue good preventive care measures, including:  frequent hand-washing, avoid touching your face, cover coughs/sneezes, stay out of crowds and keep a 6 foot distance from others.  You can go to one of the testing sites listed below, while they are opened (see hours). You do not need an order and will stay in your car during the test. You do need to self isolate until your results return and if positive 10 days from when your symptoms started and until you are 3 days fever free.   Testing Locations (Monday - Friday, 10 a.m. - 3 p.m.) . [place]: [facility] at [facility], [address] [place], [place]: [place], [place], [place], [place] (entrance off [address])  . [facility]: (Closed each Monday): Testing site relocated to the short stay covered drive at [facility]. (Use the [employer] entrance to [facility] next to [facility].    COVID-19 is a respiratory illness with symptoms that are similar to the flu. Symptoms are typically mild to moderate, but there have been cases of severe illness and death due to the virus. The following symptoms may appear 2-14 days after exposure: . Fever . Cough . Shortness of breath or difficulty breathing . Chills . Repeated shaking with chills . Muscle  pain . Headache . Sore throat . New loss of taste or smell . Fatigue . Congestion or runny nose . Nausea or vomiting . Diarrhea  If you develop fever/cough/breathlessness, please stay home for 10 days with improving symptoms and until you have had 24 hours of no fever (without taking a fever reducer).  Go to the nearest hospital ED for assessment if fever/cough/breathlessness are severe or illness seems like a threat to life.  It is vitally important that if you feel that you have an infection such as this virus or any other virus that you stay home and away from places where you may spread it to others.  You should avoid contact with people age 38 and older.   You should wear a mask or cloth face covering over your nose and mouth if you must be around other people or animals, including pets (even at home). Try to stay at least 6 feet away from other people. This will protect the people around you.  You can use medication such as A prescription cough medication called Tessalon Perles 100 mg. You may take 1-2 capsules every 8 hours as needed for cough.  You may also take acetaminophen (Tylenol) as needed for fever.   Reduce your risk of any infection by using the same precautions used for avoiding the common cold or flu:  Marland Kitchen Wash your hands often with soap and warm water for at least 20 seconds.  If soap and water are not readily available, use an alcohol-based hand sanitizer with at least 60% alcohol.  . If coughing or sneezing,  cover your mouth and nose by coughing or sneezing into the elbow areas of your shirt or coat, into a tissue or into your sleeve (not your hands). . Avoid shaking hands with others and consider head nods or verbal greetings only. . Avoid touching your eyes, nose, or mouth with unwashed hands.  . Avoid close contact with people who are sick. . Avoid places or events with large numbers of people in one location, like concerts or sporting events. . Carefully consider  travel plans you have or are making. . If you are planning any travel outside or inside the [place], visit the CDC's Travelers' Health webpage for the latest health notices. . If you have some symptoms but not all symptoms, continue to monitor at home and seek medical attention if your symptoms worsen. . If you are having a medical emergency, call 911.  HOME CARE . Only take medications as instructed by your medical team. . Drink plenty of fluids and get plenty of rest. . A steam or ultrasonic humidifier can help if you have congestion.   GET HELP RIGHT AWAY IF YOU HAVE EMERGENCY WARNING SIGNS** FOR COVID-19. If you or someone is showing any of these signs seek emergency medical care immediately. Call 911 or proceed to your closest emergency facility if: . You develop worsening high fever. . Trouble breathing . Bluish lips or face . Persistent pain or pressure in the chest . New confusion . Inability to wake or stay awake . You cough up blood. . Your symptoms become more severe  **This list is not all possible symptoms. Contact your medical provider for any symptoms that are sever or concerning to you.   MAKE SURE YOU   Understand these instructions.  Will watch your condition.  Will get help right away if you are not doing well or get worse.  Your e-visit answers were reviewed by a board certified advanced clinical practitioner to complete your personal care plan.  Depending on the condition, your plan could have included both over the counter or prescription medications.  If there is a problem please reply once you have received a response from your provider.  Your safety is important to [place].  If you have drug allergies check your prescription carefully.    You can use MyChart to ask questions about today's visit, request a non-urgent call back, or ask for a work or school excuse for 24 hours related to this e-Visit. If it has been greater than 24 hours you will need to follow up with  your provider, or enter a new e-Visit to address those concerns. You will get an e-mail in the next two days asking about your experience.  I hope that your e-visit has been valuable and will speed your recovery. Thank you for using e-visits.  Approximately 5 minutes was spent documenting and reviewing patient's chart.

## 2019-06-29 ENCOUNTER — Other Ambulatory Visit: Payer: Self-pay

## 2019-06-29 DIAGNOSIS — Z20822 Contact with and (suspected) exposure to covid-19: Secondary | ICD-10-CM

## 2019-07-01 LAB — NOVEL CORONAVIRUS, NAA: SARS-CoV-2, NAA: NOT DETECTED

## 2019-07-13 ENCOUNTER — Encounter: Payer: Self-pay | Admitting: Physician Assistant

## 2019-09-22 ENCOUNTER — Telehealth: Payer: Commercial Managed Care - PPO | Admitting: Nurse Practitioner

## 2019-09-22 DIAGNOSIS — Z20822 Contact with and (suspected) exposure to covid-19: Secondary | ICD-10-CM

## 2019-09-22 DIAGNOSIS — J029 Acute pharyngitis, unspecified: Secondary | ICD-10-CM

## 2019-09-22 NOTE — Progress Notes (Signed)
E-Visit for Corona Virus Screening  Your current symptoms could be consistent with the coronavirus.  Many health care providers can now test patients at their office but not all are.  [place] has multiple testing sites. For information on our [place] testing locations and hours go to [email or web address]  We are enrolling you in our [place] for [place] . Daily you will receive a questionnaire within the [place] website. Our COVID 19 response team will be monitoring your responses daily.  Testing Information: The COVID-19 Community Testing sites will begin testing BY APPOINTMENT ONLY.  You can schedule online at [email or web address]  If you do not have access to a smart phone or computer you may call [phone] for an appointment.   Additional testing sites in the Community:  . For [facility] Testing sites in [facility] [place]  FaceUpdate.uy  . For Pop-up testing sites in [place]  BowlDirectory.co.uk  . For Testing sites with regular hours https://onsms.org/[place]/  . For [place] [email or web address]  . For [facility] [email or web address]  . For [facility] testing in [place] and [employer] [email or web address]  . For [facility] testing in [facility]   [email or web address]  For  more information about community testing call [phone]   Please quarantine yourself while awaiting your test results. Please stay home for a minimum of 10 days from the first day of illness with improving symptoms and you have had 24 hours of no fever (without the use of Tylenol (Acetaminophen)  Motrin (Ibuprofen) or any fever reducing medication).  Also - Do not get tested prior to returning to work because once you have had a positive test the test can stay positive for more then a month in some cases.   You should wear a mask or cloth face covering over your nose and mouth if you must be around other people or animals, including pets (even at home). Try to stay at least 6 feet away from other people. This will protect the people around you.  Please continue good preventive care measures, including:  frequent hand-washing, avoid touching your face, cover coughs/sneezes, stay out of crowds and keep a 6 foot distance from others.  COVID-19 is a respiratory illness with symptoms that are similar to the flu. Symptoms are typically mild to moderate, but there have been cases of severe illness and death due to the virus.   The following symptoms may appear 2-14 days after exposure: . Fever . Cough . Shortness of breath or difficulty breathing . Chills . Repeated shaking with chills . Muscle pain . Headache . Sore throat . New loss of taste or smell . Fatigue . Congestion or runny nose . Nausea or vomiting . Diarrhea  Go to the nearest hospital ED for assessment if fever/cough/breathlessness are severe or illness seems like a threat to life.  It is vitally important that if you feel that you have an infection such as this virus or any other virus that you stay home and away from places where you may spread it to others.  You should avoid contact with people age 10 and older.   You can use medication such as delsym if develop a cough  You may also take acetaminophen (Tylenol) as needed for fever.  Reduce your risk of any infection by using the same precautions used for avoiding the common cold or flu:  Marland Kitchen Wash your hands often with soap and warm water for at least 20 seconds.  If soap and water  are not readily available, use an alcohol-based hand sanitizer with at least 60% alcohol.   . If coughing or sneezing, cover your mouth and nose by coughing or sneezing into the elbow areas of your shirt or coat, into a tissue or into your sleeve (not your hands). . Avoid shaking hands with others and consider head nods or verbal greetings only. . Avoid touching your eyes, nose, or mouth with unwashed hands.  . Avoid close contact with people who are sick. . Avoid places or events with large numbers of people in one location, like concerts or sporting events. . Carefully consider travel plans you have or are making. . If you are planning any travel outside or inside the [place], visit the CDC's Travelers' Health webpage for the latest health notices. . If you have some symptoms but not all symptoms, continue to monitor at home and seek medical attention if your symptoms worsen. . If you are having a medical emergency, call 911.  HOME CARE . Only take medications as instructed by your medical team. . Drink plenty of fluids and get plenty of rest. . A steam or ultrasonic humidifier can help if you have congestion.   GET HELP RIGHT AWAY IF YOU HAVE EMERGENCY WARNING SIGNS** FOR COVID-19. If you or someone is showing any of these signs seek emergency medical care immediately. Call 911 or proceed to your closest emergency facility if: . You develop worsening high fever. . Trouble breathing . Bluish lips or face . Persistent pain or pressure in the chest . New confusion . Inability to wake or stay awake . You cough up blood. . Your symptoms become more severe  **This list is not all possible symptoms. Contact your medical provider for any symptoms that are sever or concerning to you.  MAKE SURE YOU   Understand these instructions.  Will watch your condition.  Will get help right away if you are not doing well or get worse.  Your e-visit answers were reviewed by a board certified advanced clinical practitioner to complete your personal care plan.  Depending on the condition, your  plan could have included both over the counter or prescription medications.  If there is a problem please reply once you have received a response from your provider.  Your safety is important to [place].  If you have drug allergies check your prescription carefully.    You can use MyChart to ask questions about today's visit, request a non-urgent call back, or ask for a work or school excuse for 24 hours related to this e-Visit. If it has been greater than 24 hours you will need to follow up with your provider, or enter a new e-Visit to address those concerns. You will get an e-mail in the next two days asking about your experience.  I hope that your e-visit has been valuable and will speed your recovery. Thank you for using e-visits.   5-10 minutes spent reviewing and documenting in chart.

## 2019-09-28 ENCOUNTER — Ambulatory Visit: Payer: Commercial Managed Care - PPO

## 2019-10-03 ENCOUNTER — Ambulatory Visit: Payer: Commercial Managed Care - PPO | Attending: Internal Medicine

## 2019-10-03 DIAGNOSIS — Z23 Encounter for immunization: Secondary | ICD-10-CM | POA: Insufficient documentation

## 2019-10-03 NOTE — Progress Notes (Addendum)
   Covid-19 Vaccination Clinic  Name:  Joshua Baker    MRN: [id number] DOB: Dec 01, 1980  10/03/2019  [name] was observed post Covid-19 immunization for 20 minutes with incidence.He was observed and monitored by another RN in which he had dizziness and h/a with resolution of symptoms before leaving site today in stable condition with no concerns. See RN's separate note. He was alert, no acute distress with a normal gait and stable upon leaving. He was provided with Vaccine Information Sheet and instruction to access the V-Safe system.   [name] was instructed to call 911 with any severe reactions post vaccine: Marland Kitchen Difficulty breathing  . Swelling of your face and throat  . A fast heartbeat  . A bad rash all over your body  . Dizziness and weakness    Immunizations Administered    Name Date Dose VIS Date Route   Pfizer COVID-19 Vaccine 10/03/2019  3:45 PM 0.3 mL 07/17/2019 Intramuscular   Manufacturer: [place]   Lot: [id number]   [place]: [id number]

## 2019-10-03 NOTE — Progress Notes (Signed)
Patient arrived to observation at 1549. Patient stated,"is it normal to feel dizzy.? BP 147/91, pulse 73. At 1553, patient stated dizziness had subsided and he complained of a headache. Rechecked blood pressure at 1600 BP 146/89, Pulse 74. Patient stated his headache was subsiding. Patient walked to discharge desk and denied dizziness when standing and walking.

## 2019-10-24 ENCOUNTER — Ambulatory Visit: Payer: Commercial Managed Care - PPO | Attending: Internal Medicine

## 2019-10-24 DIAGNOSIS — Z23 Encounter for immunization: Secondary | ICD-10-CM

## 2019-10-24 NOTE — Progress Notes (Signed)
   Covid-19 Vaccination Clinic  Name:  Joshua Baker    MRN: [id number] DOB: [birth year]  10/24/2019  Joshua Baker was observed post Covid-19 immunization for 15 minutes without incident. He was provided with Vaccine Information Sheet and instruction to access the V-Safe system.   Joshua Baker was instructed to call 911 with any severe reactions post vaccine: Marland Kitchen Difficulty breathing  . Swelling of face and throat  . A fast heartbeat  . A bad rash all over body  . Dizziness and weakness   Immunizations Administered    Name Date Dose VIS Date Route   Pfizer COVID-19 Vaccine 10/24/2019 12:16 PM 0.3 mL 07/17/2019 Intramuscular   Manufacturer: [place]   Lot: [id number]   [place]: [id number]

## 2019-11-02 ENCOUNTER — Ambulatory Visit: Payer: Commercial Managed Care - PPO

## 2020-01-28 HISTORY — PX: WISDOM TOOTH EXTRACTION: SHX21

## 2020-02-18 ENCOUNTER — Encounter: Payer: Commercial Managed Care - PPO | Admitting: Physician Assistant

## 2020-02-19 ENCOUNTER — Encounter: Payer: Self-pay | Admitting: Physician Assistant

## 2020-02-19 ENCOUNTER — Ambulatory Visit (INDEPENDENT_AMBULATORY_CARE_PROVIDER_SITE_OTHER): Payer: Commercial Managed Care - PPO | Admitting: Physician Assistant

## 2020-02-19 ENCOUNTER — Other Ambulatory Visit: Payer: Self-pay

## 2020-02-19 VITALS — BP 120/88 | HR 91 | Temp 97.9°F | Ht 73.0 in | Wt 196.0 lb

## 2020-02-19 DIAGNOSIS — Z0000 Encounter for general adult medical examination without abnormal findings: Secondary | ICD-10-CM

## 2020-02-19 DIAGNOSIS — S99921A Unspecified injury of right foot, initial encounter: Secondary | ICD-10-CM | POA: Diagnosis not present

## 2020-02-19 DIAGNOSIS — Z136 Encounter for screening for cardiovascular disorders: Secondary | ICD-10-CM

## 2020-02-19 DIAGNOSIS — Z13220 Encounter for screening for lipoid disorders: Secondary | ICD-10-CM

## 2020-02-19 DIAGNOSIS — Z0001 Encounter for general adult medical examination with abnormal findings: Secondary | ICD-10-CM

## 2020-02-19 LAB — COMPREHENSIVE METABOLIC PANEL
ALT: 25 U/L (ref 0–53)
AST: 17 U/L (ref 0–37)
Albumin: 4.4 g/dL (ref 3.5–5.2)
Alkaline Phosphatase: 54 U/L (ref 39–117)
BUN: 13 mg/dL (ref 6–23)
CO2: 31 mEq/L (ref 19–32)
Calcium: 9.7 mg/dL (ref 8.4–10.5)
Chloride: 103 mEq/L (ref 96–112)
Creatinine, Ser: 0.93 mg/dL (ref 0.40–1.50)
GFR: 90.45 mL/min (ref >60.00)
Glucose, Bld: 82 mg/dL (ref 70–99)
Potassium: 4.9 mEq/L (ref 3.5–5.1)
Sodium: 138 mEq/L (ref 135–145)
Total Bilirubin: 0.5 mg/dL (ref 0.2–1.2)
Total Protein: 7 g/dL (ref 6.0–8.3)

## 2020-02-19 LAB — CBC WITH DIFFERENTIAL/PLATELET
Basophils Absolute: 0.1 10*3/uL (ref 0.0–0.1)
Basophils Relative: 1.1 % (ref 0.0–3.0)
Eosinophils Absolute: 0.2 10*3/uL (ref 0.0–0.7)
Eosinophils Relative: 2.9 % (ref 0.0–5.0)
HCT: 44.5 % (ref 39.0–52.0)
Hemoglobin: 15.1 g/dL (ref 13.0–17.0)
Lymphocytes Relative: 28 % (ref 12.0–46.0)
Lymphs Abs: 2 10*3/uL (ref 0.7–4.0)
MCHC: 33.9 g/dL (ref 30.0–36.0)
MCV: 84.7 fl (ref 78.0–100.0)
Monocytes Absolute: 0.6 10*3/uL (ref 0.1–1.0)
Monocytes Relative: 8.9 % (ref 3.0–12.0)
Neutro Abs: 4.3 10*3/uL (ref 1.4–7.7)
Neutrophils Relative %: 59.1 % (ref 43.0–77.0)
Platelets: 311 10*3/uL (ref 150.0–400.0)
RBC: 5.25 Mil/uL (ref 4.22–5.81)
RDW: 13.3 % (ref 11.5–15.5)
WBC: 7.2 10*3/uL (ref 4.0–10.5)

## 2020-02-19 LAB — LIPID PANEL
Cholesterol: 183 mg/dL (ref 0–200)
HDL: 44.8 mg/dL (ref >39.00)
LDL Cholesterol: 119 mg/dL — ABNORMAL HIGH (ref 0–99)
NonHDL: 138.04
Total CHOL/HDL Ratio: 4
Triglycerides: 93 mg/dL (ref 0.0–149.0)
VLDL: 18.6 mg/dL (ref 0.0–40.0)

## 2020-02-19 NOTE — Progress Notes (Signed)
I acted as a Education administrator for [employer], PA-C [place], [place]  Subjective:    Joshua Baker is a 39 y.o. male and is here for a comprehensive physical exam.  HPI  Health Maintenance Due  Topic Date Due  . Hepatitis C Screening  Never done    Acute Concerns: Foot pain -- Pt stepped on a nail on right foot about a week ago. He is UTD on tetanus. The nail was not rusty and he was able to clean the area well. Pain improving daily. Denies: fevers, significant swelling, joint pain, numbness/tingling.  Chronic Issues: None  Health Maintenance: Immunizations -- UTD Colonoscopy -- N/A PSA -- N/A Diet -- eats healthy overall Sleep habits -- denies concerns Exercise -- does swimming occasionally Weight -- Weight: 196 lb (88.9 kg)  Weight history Wt Readings from Last 10 Encounters:  02/19/20 196 lb (88.9 kg)  02/16/19 196 lb 8 oz (89.1 kg)  02/11/18 193 lb (87.5 kg)  11/06/16 188 lb (85.3 kg)   Body mass index is 25.86 kg/m. Mood -- overall good Tobacco use --    Tobacco Use: Medium Risk  . Smoking Tobacco Use: Former Smoker  . Smokeless Tobacco Use: Never Used    Alcohol use ---  reports current alcohol use of about 5.0 standard drinks of alcohol per week.   Depression screen PHQ 2/9 02/19/2020  Decreased Interest 0  Down, Depressed, Hopeless 0  PHQ - 2 Score 0     Other providers/specialists: Patient Care Team: Inda Coke, [place] as PCP - General (Physician Assistant)   PMHx, SurgHx, SocialHx, Medications, and Allergies were reviewed in the Visit Navigator and updated as appropriate.   History reviewed. No pertinent past medical history.   Past Surgical History:  Procedure Laterality Date  . WISDOM TOOTH EXTRACTION  01/28/2020     Family History  Problem Relation Age of Onset  . Other Mother        borderline personality  . Cancer Father        Prostate  . Parkinson's disease Maternal Grandfather   . Colon cancer Neg Hx     Social History    Tobacco Use  . Smoking status: Former Smoker    Types: Cigars  . Smokeless tobacco: Never Used  . Tobacco comment: rarely  Substance Use Topics  . Alcohol use: Yes    Alcohol/week: 5.0 standard drinks    Types: 5 Cans of beer per week  . Drug use: No    Review of Systems:   Review of Systems  Constitutional: Negative for chills, fever, malaise/fatigue and weight loss.  HENT: Negative for hearing loss, sinus pain and sore throat.   Respiratory: Negative for cough and hemoptysis.   Cardiovascular: Negative for chest pain, palpitations, leg swelling and PND.  Gastrointestinal: Negative for abdominal pain, constipation, diarrhea, heartburn, nausea and vomiting.  Genitourinary: Negative for dysuria, frequency and urgency.  Musculoskeletal: Negative for back pain, myalgias and neck pain.  Skin: Negative for itching and rash.  Neurological: Negative for dizziness, tingling, seizures and headaches.  Endo/Heme/Allergies: Negative for polydipsia.  Psychiatric/Behavioral: Negative for depression. The patient is not nervous/anxious.       Objective:   Vitals:   02/19/20 0910  BP: 120/88  Pulse: 91  Temp: 97.9 F (36.6 C)  SpO2: 95%   Body mass index is 25.86 kg/m.  General Appearance:  Alert, cooperative, no distress, appears stated age  Head:  Normocephalic, without obvious abnormality, atraumatic  Eyes:  PERRL, conjunctiva/corneas clear,  EOM's intact, fundi benign, both eyes       Ears:  Normal TM's and external ear canals, both ears  Nose: Nares normal, septum midline, mucosa normal, no drainage    or sinus tenderness  Throat: Lips, mucosa, and tongue normal; teeth and gums normal  Neck: Supple, symmetrical, trachea midline, no adenopathy; thyroid:  No enlargement/tenderness/nodules; no carotit bruit or JVD  Back:   Symmetric, no curvature, ROM normal, no CVA tenderness  Lungs:   Clear to auscultation bilaterally, respirations unlabored  Chest wall:  No tenderness or  deformity  Heart:  Regular rate and rhythm, S1 and S2 normal, no murmur, rub   or gallop  Abdomen:   Soft, non-tender, bowel sounds active all four quadrants, no masses, no organomegaly  Extremities: Extremities normal, atraumatic, no cyanosis or edema  Prostate: Not done.   Skin: Skin color, texture, turgor normal, no rashes or lesions Small skin laceration on plantar surface of foot at base of R great toe without erythema or discharge; slight TTP  Lymph nodes: Cervical, supraclavicular, and axillary nodes normal  Neurologic: CNII-XII grossly intact. Normal strength, sensation and reflexes throughout    Assessment/Plan:   Joshua Baker was seen today for annual exam.  Diagnoses and all orders for this visit:  Routine physical examination Today patient counseled on age appropriate routine health concerns for screening and prevention, each reviewed and up to date or declined. Immunizations reviewed and up to date or declined. Labs ordered and reviewed. Risk factors for depression reviewed and negative. Hearing function and visual acuity are intact. ADLs screened and addressed as needed. Functional ability and level of safety reviewed and appropriate. Education, counseling and referrals performed based on assessed risks today. Patient provided with a copy of personalized plan for preventive services.  Foot injury, right, initial encounter Area appears to be healing well, no red flags on exam. Worsening precautions advised. Tetanus UTD. -     CBC w/Diff -     Comprehensive metabolic panel  Encounter for lipid screening for cardiovascular disease -     Lipid panel  Well Adult Exam: Labs ordered: Yes. Patient counseling was done. See below for items discussed.   Patient Counseling: [x]   Nutrition: Stressed importance of moderation in sodium/caffeine intake, saturated fat and cholesterol, caloric balance, sufficient intake of fresh fruits, vegetables, and fiber.  [x]   Stressed the importance of  regular exercise.   []   Substance Abuse: Discussed cessation/primary prevention of tobacco, alcohol, or other drug use; driving or other dangerous activities under the influence; availability of treatment for abuse.   [x]   Injury prevention: Discussed safety belts, safety helmets, smoke detector, smoking near bedding or upholstery.   []   Sexuality: Discussed sexually transmitted diseases, partner selection, use of condoms, avoidance of unintended pregnancy  and contraceptive alternatives.   [x]   Dental health: Discussed importance of regular tooth brushing, flossing, and dental visits.  [x]   Health maintenance and immunizations reviewed. Please refer to Health maintenance section.    CMA or LPN served as scribe during this visit. History, Physical, and Plan performed by medical provider. The above documentation has been reviewed and is accurate and complete.  Inda Coke, PA-C [place]

## 2020-02-19 NOTE — Patient Instructions (Signed)

## 2020-11-08 ENCOUNTER — Telehealth: Payer: Self-pay

## 2020-11-08 NOTE — Telephone Encounter (Signed)
Pt is having to move his physical from July because [facility] will be out. Pt asked if there is anyway possible that I can schedule him with a different provider in July. He stated that his insurance requires a physical at exactly every 12 months or he has to pay a premium for the months hes late. Please advise    [phone]

## 2020-11-17 NOTE — Telephone Encounter (Signed)
I have spoken with patient in regard.  I am going to research these concerns.  I have apologized for calling so late.  Informed him that if he had any other concerns to ask for me when he calls in.

## 2021-02-20 ENCOUNTER — Encounter: Payer: Commercial Managed Care - PPO | Admitting: Physician Assistant

## 2021-03-01 ENCOUNTER — Encounter: Payer: Self-pay | Admitting: Physician Assistant

## 2021-03-01 ENCOUNTER — Ambulatory Visit (INDEPENDENT_AMBULATORY_CARE_PROVIDER_SITE_OTHER): Payer: Commercial Managed Care - PPO | Admitting: Physician Assistant

## 2021-03-01 ENCOUNTER — Other Ambulatory Visit: Payer: Self-pay

## 2021-03-01 VITALS — BP 128/85 | HR 75 | Temp 98.7°F | Ht 73.0 in | Wt 205.0 lb

## 2021-03-01 DIAGNOSIS — D485 Neoplasm of uncertain behavior of skin: Secondary | ICD-10-CM

## 2021-03-01 DIAGNOSIS — Z13220 Encounter for screening for lipoid disorders: Secondary | ICD-10-CM

## 2021-03-01 DIAGNOSIS — Z0000 Encounter for general adult medical examination without abnormal findings: Secondary | ICD-10-CM | POA: Diagnosis not present

## 2021-03-01 DIAGNOSIS — Z131 Encounter for screening for diabetes mellitus: Secondary | ICD-10-CM

## 2021-03-01 LAB — COMPREHENSIVE METABOLIC PANEL
ALT: 38 U/L (ref 0–53)
AST: 26 U/L (ref 0–37)
Albumin: 4.4 g/dL (ref 3.5–5.2)
Alkaline Phosphatase: 57 U/L (ref 39–117)
BUN: 11 mg/dL (ref 6–23)
CO2: 28 mEq/L (ref 19–32)
Calcium: 9.7 mg/dL (ref 8.4–10.5)
Chloride: 103 mEq/L (ref 96–112)
Creatinine, Ser: 0.95 mg/dL (ref 0.40–1.50)
GFR: 100.45 mL/min (ref >60.00)
Glucose, Bld: 83 mg/dL (ref 70–99)
Potassium: 4.2 mEq/L (ref 3.5–5.1)
Sodium: 139 mEq/L (ref 135–145)
Total Bilirubin: 0.5 mg/dL (ref 0.2–1.2)
Total Protein: 7.7 g/dL (ref 6.0–8.3)

## 2021-03-01 LAB — LIPID PANEL
Cholesterol: 193 mg/dL (ref 0–200)
HDL: 43.1 mg/dL (ref >39.00)
LDL Cholesterol: 119 mg/dL — ABNORMAL HIGH (ref 0–99)
NonHDL: 149.7
Total CHOL/HDL Ratio: 4
Triglycerides: 153 mg/dL — ABNORMAL HIGH (ref 0.0–149.0)
VLDL: 30.6 mg/dL (ref 0.0–40.0)

## 2021-03-01 NOTE — Patient Instructions (Addendum)
Good to meet you today! Please go to the lab for blood work and I will send results through [facility]. Keep up the good work! Call if any concerns.  See you back in 1 year.

## 2021-03-01 NOTE — Progress Notes (Signed)
Established Patient Office Visit  Subjective:  Patient ID: Enoch Chevalier, male    DOB: 10/14/1980  Age: 40 y.o. MRN: [id number]  CC:  Chief Complaint  Patient presents with   Annual Exam    HPI Sanjith Gostomski presents for annual CPE. Married, three children, works for [employer]. Says life is good overall. Wife is just starting speech pathology grad program.   Acute concerns: None  Health maintenance: Lifestyle/ exercise: Walks with wife daily, enjoys sports  Nutrition: Eating out more often than they used to Mental health: Stress level much better since change in position at work  Caffeine: 1/2 and 1/2 coffee in the morning, about 12 oz Sleep: No concerns, sleeps well  Substance use: Occasional alcohol, no others  Immunizations: UTD   History reviewed. No pertinent past medical history.  Past Surgical History:  Procedure Laterality Date   WISDOM TOOTH EXTRACTION  01/28/2020    Family History  Problem Relation Age of Onset   Other Mother        borderline personality   Cancer Father        Prostate   Parkinson's disease Maternal Grandfather    Colon cancer Neg Hx     Social History   Socioeconomic History   Marital status: Married    Spouse name: Not on file   Number of children: Not on file   Years of education: Not on file   Highest education level: Not on file  Occupational History   Not on file  Tobacco Use   Smoking status: Former    Types: Cigars   Smokeless tobacco: Never   Tobacco comments:    rarely  Substance and Sexual Activity   Alcohol use: Yes    Alcohol/week: 5.0 standard drinks    Types: 5 Cans of beer per week   Drug use: No   Sexual activity: Yes  Other Topics Concern   Not on file  Social History Narrative   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     cetirizine (ZYRTEC) 10 MG tablet Take 10 mg by mouth daily.     ibuprofen (ADVIL) 200 MG tablet Take 400 mg by mouth as needed.     loratadine (CLARITIN) 10 MG tablet Take 10 mg by mouth daily. (Patient not taking: Reported on 02/19/2020)     No facility-administered medications prior to visit.    Allergies  Allergen Reactions   Bee Venom Swelling    Any Stinging insects, severe local swelling   Pollen Extract     ROS Review of Systems  Constitutional:  Negative for activity change, appetite change and unexpected weight change.  HENT:  Negative for congestion.   Eyes:  Negative for visual disturbance.  Respiratory:  Negative for apnea and shortness of breath.   Cardiovascular:  Negative for chest pain.  Gastrointestinal:  Negative for abdominal pain and blood in stool.  Endocrine: Negative for polydipsia, polyphagia and polyuria.  Genitourinary:  Negative for difficulty urinating.  Musculoskeletal:  Negative for arthralgias.  Skin:  Negative for rash.  Neurological:  Negative for seizures, weakness and headaches.  Psychiatric/Behavioral:  Negative for sleep disturbance and suicidal ideas.      Objective:    Physical Exam Vitals and nursing note reviewed.  Constitutional:      Appearance: Normal appearance.  HENT:     Head: Normocephalic and atraumatic.     Right Ear: External ear normal.     Left Ear: External ear normal.     Nose: Nose normal.  Cardiovascular:     Rate and Rhythm: Normal rate and regular rhythm.     Pulses: Normal pulses.     Heart sounds: Normal heart sounds. No murmur heard. Pulmonary:     Effort: Pulmonary effort is normal.      Breath sounds: Normal breath sounds.  Skin:    General: Skin is warm and dry.     Comments: Abdomen - raised but flat multi-colored nevus with some irregularity, about 1 cm in size  Neurological:     General: No focal deficit present.     Mental Status: He is alert and oriented to person, place, and time.  Psychiatric:        Mood and Affect: Mood normal.        Behavior: Behavior normal.    BP 128/85   Pulse 75   Temp 98.7 F (37.1 C)   Ht '6\' 1"'$  (1.854 m)   Wt 205 lb (93 kg)   SpO2 98%   BMI 27.05 kg/m  Wt Readings from Last 3 Encounters:  03/01/21 205 lb (93 kg)  02/19/20 196 lb (88.9 kg)  02/16/19 196 lb 8 oz (89.1 kg)     Health Maintenance Due  Topic Date Due   Hepatitis C Screening  Never done   COVID-19 Vaccine (3 - Booster for Pfizer series) 03/25/2020    There are no preventive care reminders to display for this patient.  No results found for: TSH Lab Results  Component Value Date   WBC 7.2 02/19/2020   HGB 15.1 02/19/2020   HCT 44.5 02/19/2020   MCV 84.7 02/19/2020   PLT 311.0 02/19/2020   Lab Results  Component Value Date   NA 138 02/19/2020   K 4.9 02/19/2020   CO2 31 02/19/2020   GLUCOSE 82 02/19/2020   BUN 13 02/19/2020   CREATININE 0.93 02/19/2020   BILITOT 0.5 02/19/2020   ALKPHOS 54 02/19/2020   AST 17 02/19/2020   ALT 25 02/19/2020   PROT 7.0 02/19/2020   ALBUMIN 4.4 02/19/2020   CALCIUM 9.7 02/19/2020   GFR 90.45 02/19/2020   Lab Results  Component Value Date   CHOL 183 02/19/2020   Lab Results  Component Value Date   HDL 44.80 02/19/2020   Lab Results  Component Value Date   LDLCALC 119 (H) 02/19/2020   Lab Results  Component Value Date   TRIG 93.0 02/19/2020   Lab Results  Component Value Date   CHOLHDL 4 02/19/2020   No results found for: HGBA1C    Assessment & Plan:   Problem List Items Addressed This Visit   None Visit Diagnoses     Routine physical examination    -  Primary   Relevant Orders    Comprehensive metabolic panel   Lipid panel   Diabetes mellitus screening       Relevant Orders   Comprehensive metabolic panel   Screening for cholesterol  level       Relevant Orders   Lipid panel   Neoplasm of uncertain behavior of skin           No orders of the defined types were placed in this encounter.   Follow-up: Return in about 1 year (around 03/01/2022) for CPE and fasting labs.   1. Routine physical examination 2. Diabetes mellitus screening 3. Screening for cholesterol level Age-appropriate screening and counseling performed today. Will check labs and call with results. Preventive measures discussed and printed in AVS for patient. Doing well overall. UTD on preventive items.  4. Neoplasm of uncertain behavior of skin He is going to reach out to dermatology for removal. Offered punch biopsy here. He will let us know if he has any trouble connecting with derm.    Manuel Dall M Marco Raper, PA-C

## 2021-03-02 ENCOUNTER — Encounter: Payer: Self-pay | Admitting: Physician Assistant

## 2021-03-20 ENCOUNTER — Encounter: Payer: Self-pay | Admitting: Physician Assistant

## 2021-07-28 ENCOUNTER — Telehealth (INDEPENDENT_AMBULATORY_CARE_PROVIDER_SITE_OTHER): Payer: Commercial Managed Care - PPO | Admitting: Family

## 2021-07-28 ENCOUNTER — Telehealth: Payer: Commercial Managed Care - PPO | Admitting: Physician Assistant

## 2021-07-28 ENCOUNTER — Encounter: Payer: Self-pay | Admitting: Family

## 2021-07-28 ENCOUNTER — Other Ambulatory Visit: Payer: Self-pay

## 2021-07-28 VITALS — Ht 73.0 in | Wt 205.0 lb

## 2021-07-28 DIAGNOSIS — U071 COVID-19: Secondary | ICD-10-CM

## 2021-07-28 MED ORDER — BENZONATATE 100 MG PO CAPS: 100.0000 mg | ORAL_CAPSULE | Freq: Three times a day (TID) | ORAL | 0 refills | Status: DC | PRN

## 2021-07-28 MED ORDER — FLUTICASONE PROPIONATE 50 MCG/ACT NA SUSP: 2.0000 | Freq: Every day | NASAL | 0 refills | Status: DC

## 2021-07-28 MED ORDER — NIRMATRELVIR/RITONAVIR (PAXLOVID)TABLET: 3.0000 | ORAL_TABLET | Freq: Two times a day (BID) | ORAL | 0 refills | Status: AC

## 2021-07-28 NOTE — Progress Notes (Signed)
E-Visit  for Positive Covid Test Result  We are sorry you are not feeling well. We are here to help!  You have tested positive for COVID-19, meaning that you were infected with the novel coronavirus and could give the virus to others.  It is vitally important that you stay home so you do not spread it to others.      Please continue isolation at home, for at least 10 days since the start of your symptoms and until you have had 24 hours with no fever (without taking a fever reducer) and with improving of symptoms.  If you have no symptoms but tested positive (or all symptoms resolve after 5 days and you have no fever) you can leave your house but continue to wear a mask around others for an additional 5 days. If you have a fever,continue to stay home until you have had 24 hours of no fever. Most cases improve 5-10 days from onset but we have seen a small number of patients who have gotten worse after the 10 days.  Please be sure to watch for worsening symptoms and remain taking the proper precautions.   Go to the nearest hospital ED for assessment if fever/cough/breathlessness are severe or illness seems like a threat to life.    The following symptoms may appear 2-14 days after exposure: Fever Cough Shortness of breath or difficulty breathing Chills Repeated shaking with chills Muscle pain Headache Sore throat New loss of taste or smell Fatigue Congestion or runny nose Nausea or vomiting Diarrhea  You have been enrolled in [facility] for COVID-19. Daily you will receive a questionnaire within the [facility] website. Our COVID-19 response team will be monitoring your responses daily.  You can use medication such as prescription cough medication called Tessalon Perles 100 mg. You may take 1-2 capsules every 8 hours as needed for cough and prescription for Fluticasone nasal spray 2 sprays in each nostril one time per day  You may also take acetaminophen (Tylenol) as needed  for fever.  HOME CARE: Only take medications as instructed by your medical team. Drink plenty of fluids and get plenty of rest. A steam or ultrasonic humidifier can help if you have congestion.   GET HELP RIGHT AWAY IF YOU HAVE EMERGENCY WARNING SIGNS.  Call 911 or proceed to your closest emergency facility if: You develop worsening high fever. Trouble breathing Bluish lips or face Persistent pain or pressure in the chest New confusion Inability to wake or stay awake You cough up blood. Your symptoms become more severe Inability to hold down food or fluids  This list is not all possible symptoms. Contact your medical provider for any symptoms that are severe or concerning to you.    Your e-visit answers were reviewed by a board certified advanced clinical practitioner to complete your personal care plan.  Depending on the condition, your plan could have included both over the counter or prescription medications.  If there is a problem please reply once you have received a response from your provider.  Your safety is important to [place].  If you have drug allergies check your prescription carefully.    You can use MyChart to ask questions about today's visit, request a non-urgent call back, or ask for a work or school excuse for 24 hours related to this e-Visit. If it has been greater than 24 hours you will need to follow up with your provider, or enter a new e-Visit to address those  concerns. You will get an e-mail in the next two days asking about your experience.  I hope that your e-visit has been valuable and will speed your recovery. Thank you for using e-visits.  I provided 5 minutes of non face-to-face time during this encounter for chart review and documentation.

## 2021-07-28 NOTE — Progress Notes (Signed)
MyChart Video Visit    Virtual Visit via Video Note   This visit type was conducted due to national recommendations for restrictions regarding the COVID-19 Pandemic (e.g. social distancing) in an effort to limit this patient's exposure and mitigate transmission in our community. This patient is at least at moderate risk for complications without adequate follow up. This format is felt to be most appropriate for this patient at this time. Physical exam was limited by quality of the video and audio technology used for the visit. CMA was able to get the patient set up on a video visit.  Patient location: Home. Patient and provider in visit Provider location: Office  I discussed the limitations of evaluation and management by telemedicine and the availability of in person appointments. The patient expressed understanding and agreed to proceed.  Visit Date: 07/28/2021  Today's healthcare provider: Jeanie Sewer, NP     Subjective:    Patient ID: Joshua Baker, male    DOB: 1980/11/29, 40 y.o.   MRN: [id number]  Chief Complaint  Patient presents with   Covid Positive    Tested positive this morning. Have not taken anything OTC for symptoms.    HPI Upper Respiratory Infection: Symptoms include nasal congestion and non productive cough.  Onset of symptoms was 1 day ago, gradually worsening since that time. He is drinking moderate amounts of fluids. Evaluation to date: none.  Treatment to date: none.    History reviewed. No pertinent past medical history.  Past Surgical History:  Procedure Laterality Date   WISDOM TOOTH EXTRACTION  01/28/2020    Outpatient Medications Prior to Visit  Medication Sig Dispense Refill   acetaminophen (TYLENOL) 325 MG tablet Take 650 mg by mouth as needed.     cetirizine (ZYRTEC) 10 MG tablet Take 10 mg by mouth daily.     ibuprofen (ADVIL) 200 MG tablet Take 400 mg by mouth as needed.     fluticasone (FLONASE) 50 MCG/ACT nasal spray Place  2 sprays into both nostrils daily. (Patient not taking: Reported on 07/28/2021) 16 g 0   benzonatate (TESSALON) 100 MG capsule Take 1 capsule (100 mg total) by mouth 3 (three) times daily as needed. (Patient not taking: Reported on 07/28/2021) 30 capsule 0   No facility-administered medications prior to visit.    Allergies  Allergen Reactions   Bee Venom Swelling    Any Stinging insects, severe local swelling   Pollen Extract         Objective:     Physical Exam Vitals and nursing note reviewed.  Constitutional:      General: She is not in acute distress.    Appearance: Normal appearance.  HENT:     Head: Normocephalic.  Pulmonary:     Effort: No respiratory distress.  Musculoskeletal:     Cervical back: Normal range of motion.  Skin:    General: Skin is dry.     Coloration: Skin is not pale.  Neurological:     Mental Status: She is alert and oriented to person, place, and time.  Psychiatric:        Mood and Affect: Mood normal.   Ht 6\' 1"  (1.854 m)    Wt 205 lb 0.4 oz (93 kg)    BMI 27.05 kg/m   Wt Readings from Last 3 Encounters:  07/28/21 205 lb 0.4 oz (93 kg)  03/01/21 205 lb (93 kg)  02/19/20 196 lb (88.9 kg)       Assessment & Plan:  Problem List Items Addressed This Visit       Other   [id number]-[id number] - Primary    sending Paxlovid. pt advised on FDA label of emergency use, how to take & SE. OK to continue OTC sinus and Tylenol or Ibuprofen prn. Advised of CDC guidelines for self isolation/ ending isolation.  Advised of safe practice guidelines. Symptom Tier reviewed.  Encouraged to monitor for any worsening symptoms; watch for increased shortness of breath, weakness, and signs of dehydration.  Instructed to rest and hydrate well.        Relevant Medications   nirmatrelvir/ritonavir EUA (PAXLOVID) 20 x 150 MG & 10 x 100MG  TABS    Meds ordered this encounter  Medications   nirmatrelvir/ritonavir EUA (PAXLOVID) 20 x 150 MG & 10 x 100MG  TABS    Sig: Take 3  tablets by mouth 2 (two) times daily for 5 days. (Take nirmatrelvir 150 mg two tablets twice daily for 5 days and ritonavir 100 mg one tablet twice daily for 5 days) Patient GFR is 100    Dispense:  30 tablet    Refill:  0    Order Specific Question:   Supervising Provider    Answer:   ANDY, CAMILLE L [[id number]]    I discussed the assessment and treatment plan with the patient. The patient was provided an opportunity to ask questions and all were answered. The patient agreed with the plan and demonstrated an understanding of the instructions.   The patient was advised to call back or seek an in-person evaluation if the symptoms worsen or if the condition fails to improve as anticipated.  I provided 22 minutes of face-to-face time during this encounter.   Jeanie Sewer, NP [place] [phone] (phone) [phone] (fax)  [place]

## 2021-07-28 NOTE — Assessment & Plan Note (Signed)
sending Paxlovid. pt advised on FDA label of emergency use, how to take & SE. OK to continue OTC sinus and Tylenol or Ibuprofen prn. Advised of CDC guidelines for self isolation/ ending isolation.  Advised of safe practice guidelines. Symptom Tier reviewed.  Encouraged to monitor for any worsening symptoms; watch for increased shortness of breath, weakness, and signs of dehydration.  Instructed to rest and hydrate well.

## 2021-07-31 ENCOUNTER — Encounter: Payer: Self-pay | Admitting: Family

## 2022-03-05 ENCOUNTER — Encounter: Payer: Self-pay | Admitting: Physician Assistant

## 2022-03-05 ENCOUNTER — Ambulatory Visit (INDEPENDENT_AMBULATORY_CARE_PROVIDER_SITE_OTHER): Payer: Commercial Managed Care - PPO | Admitting: Physician Assistant

## 2022-03-05 VITALS — BP 110/72 | HR 70 | Temp 97.6°F | Ht 74.0 in | Wt 203.2 lb

## 2022-03-05 DIAGNOSIS — E663 Overweight: Secondary | ICD-10-CM | POA: Diagnosis not present

## 2022-03-05 DIAGNOSIS — Z125 Encounter for screening for malignant neoplasm of prostate: Secondary | ICD-10-CM

## 2022-03-05 DIAGNOSIS — Z0000 Encounter for general adult medical examination without abnormal findings: Secondary | ICD-10-CM | POA: Diagnosis not present

## 2022-03-05 DIAGNOSIS — Z1159 Encounter for screening for other viral diseases: Secondary | ICD-10-CM

## 2022-03-05 DIAGNOSIS — Z1283 Encounter for screening for malignant neoplasm of skin: Secondary | ICD-10-CM

## 2022-03-05 LAB — COMPREHENSIVE METABOLIC PANEL
ALT: 27 U/L (ref 0–53)
AST: 17 U/L (ref 0–37)
Albumin: 4.3 g/dL (ref 3.5–5.2)
Alkaline Phosphatase: 51 U/L (ref 39–117)
BUN: 11 mg/dL (ref 6–23)
CO2: 28 mEq/L (ref 19–32)
Calcium: 9.5 mg/dL (ref 8.4–10.5)
Chloride: 104 mEq/L (ref 96–112)
Creatinine, Ser: 0.99 mg/dL (ref 0.40–1.50)
GFR: 94.93 mL/min (ref >60.00)
Glucose, Bld: 79 mg/dL (ref 70–99)
Potassium: 4.3 mEq/L (ref 3.5–5.1)
Sodium: 139 mEq/L (ref 135–145)
Total Bilirubin: 0.8 mg/dL (ref 0.2–1.2)
Total Protein: 7.3 g/dL (ref 6.0–8.3)

## 2022-03-05 LAB — CBC WITH DIFFERENTIAL/PLATELET
Basophils Absolute: 0.1 10*3/uL (ref 0.0–0.1)
Basophils Relative: 1.1 % (ref 0.0–3.0)
Eosinophils Absolute: 0.3 10*3/uL (ref 0.0–0.7)
Eosinophils Relative: 3.7 % (ref 0.0–5.0)
HCT: 45.2 % (ref 39.0–52.0)
Hemoglobin: 15.1 g/dL (ref 13.0–17.0)
Lymphocytes Relative: 29.4 % (ref 12.0–46.0)
Lymphs Abs: 2 10*3/uL (ref 0.7–4.0)
MCHC: 33.4 g/dL (ref 30.0–36.0)
MCV: 84.4 fl (ref 78.0–100.0)
Monocytes Absolute: 0.7 10*3/uL (ref 0.1–1.0)
Monocytes Relative: 9.5 % (ref 3.0–12.0)
Neutro Abs: 3.9 10*3/uL (ref 1.4–7.7)
Neutrophils Relative %: 56.3 % (ref 43.0–77.0)
Platelets: 274 10*3/uL (ref 150.0–400.0)
RBC: 5.35 Mil/uL (ref 4.22–5.81)
RDW: 13.6 % (ref 11.5–15.5)
WBC: 7 10*3/uL (ref 4.0–10.5)

## 2022-03-05 LAB — LIPID PANEL
Cholesterol: 182 mg/dL (ref 0–200)
HDL: 35.8 mg/dL — ABNORMAL LOW (ref >39.00)
LDL Cholesterol: 113 mg/dL — ABNORMAL HIGH (ref 0–99)
NonHDL: 146.5
Total CHOL/HDL Ratio: 5
Triglycerides: 169 mg/dL — ABNORMAL HIGH (ref 0.0–149.0)
VLDL: 33.8 mg/dL (ref 0.0–40.0)

## 2022-03-05 LAB — PSA: PSA: 0.55 ng/mL (ref 0.10–4.00)

## 2022-03-05 NOTE — Progress Notes (Signed)
Subjective:    Joshua Baker is a 41 y.o. male and is here for a comprehensive physical exam.  HPI  Health Maintenance Due  Topic Date Due   Hepatitis C Screening  Never done    Acute Concerns: None  Chronic Issues: None   Health Maintenance: Immunizations -- UTD Colonoscopy --N/A  PSA -- No results found for: "PSA1", "PSA" Diet -- Trying to cut down on fast foods.  Sleep habits -- No concern  Exercise -- Soccer team  Weight -- 203 Ib (92.2 kg)  Weight history Wt Readings from Last 10 Encounters:  03/05/22 203 lb 4 oz (92.2 kg)  07/28/21 205 lb 0.4 oz (93 kg)  03/01/21 205 lb (93 kg)  02/19/20 196 lb (88.9 kg)  02/16/19 196 lb 8 oz (89.1 kg)  02/11/18 193 lb (87.5 kg)  11/06/16 188 lb (85.3 kg)   Body mass index is 26.1 kg/m. Mood -- Stable -has been seeing counseling.  Tobacco use -- None  Tobacco Use: Medium Risk (03/05/2022)   Patient History    Smoking Tobacco Use: Former    Smokeless Tobacco Use: Never    Passive Exposure: Not on file    Alcohol use ---  reports current alcohol use of about 3.0 standard drinks of alcohol per week.      03/05/2022    8:59 AM  Depression screen PHQ 2/9  Decreased Interest 0  Down, Depressed, Hopeless 0  PHQ - 2 Score 0     Other providers/specialists: Patient Care Team: Inda Coke, [place] as PCP - General (Physician Assistant)   PMHx, SurgHx, SocialHx, Medications, and Allergies were reviewed in the Visit Navigator and updated as appropriate.   Past Medical History:  Diagnosis Date   Allergy 03/2005     Past Surgical History:  Procedure Laterality Date   WISDOM TOOTH EXTRACTION  01/28/2020     Family History  Problem Relation Age of Onset   Other Mother        borderline personality   Cancer Father        Prostate   Parkinson's disease Maternal Grandfather    Colon cancer Neg Hx     Social History   Tobacco Use   Smoking status: Former    Types: Cigars   Smokeless tobacco: Never    Tobacco comments:    rarely  Vaping Use   Vaping Use: Never used  Substance Use Topics   Alcohol use: Yes    Alcohol/week: 3.0 standard drinks of alcohol    Types: 3 Cans of beer per week   Drug use: No    Review of Systems:   Review of Systems  Constitutional:  Negative for chills, fever, malaise/fatigue and weight loss.  HENT:  Negative for hearing loss, sinus pain and sore throat.   Respiratory:  Negative for cough and hemoptysis.   Cardiovascular:  Negative for chest pain, palpitations, leg swelling and PND.  Gastrointestinal:  Negative for abdominal pain, constipation, diarrhea, heartburn, nausea and vomiting.  Genitourinary:  Negative for dysuria, frequency and urgency.  Musculoskeletal:  Negative for back pain, myalgias and neck pain.  Skin:  Negative for itching and rash.  Neurological:  Negative for dizziness, tingling, seizures and headaches.  Endo/Heme/Allergies:  Negative for polydipsia.  Psychiatric/Behavioral:  Negative for depression. The patient is not nervous/anxious.      Objective:   Vitals:   03/05/22 0900  BP: 110/72  Pulse: 70  Temp: 97.6 F (36.4 C)  SpO2: 96%  Body mass index is 26.1 kg/m.  General Appearance:  Alert, cooperative, no distress, appears stated age  Head:  Normocephalic, without obvious abnormality, atraumatic  Eyes:  PERRL, conjunctiva/corneas clear, EOM's intact, fundi benign, both eyes       Ears:  Normal TM's and external ear canals, both ears  Nose: Nares normal, septum midline, mucosa normal, no drainage    or sinus tenderness  Throat: Lips, mucosa, and tongue normal; teeth and gums normal  Neck: Supple, symmetrical, trachea midline, no adenopathy; thyroid:  No enlargement/tenderness/nodules; no carotit bruit or JVD  Back:   Symmetric, no curvature, ROM normal, no CVA tenderness  Lungs:   Clear to auscultation bilaterally, respirations unlabored  Chest wall:  No tenderness or deformity  Heart:  Regular rate and rhythm, S1  and S2 normal, no murmur, rub   or gallop  Abdomen:   Soft, non-tender, bowel sounds active all four quadrants, no masses, no organomegaly  Extremities: Extremities normal, atraumatic, no cyanosis or edema  Prostate: Not done.   Skin: Skin color, texture, turgor normal, no rashes or lesions  Lymph nodes: Cervical, supraclavicular, and axillary nodes normal  Neurologic: CNII-XII grossly intact. Normal strength, sensation and reflexes throughout    Assessment/Plan:   Routine physical examination Today patient counseled on age appropriate routine health concerns for screening and prevention, each reviewed and up to date or declined. Immunizations reviewed and up to date or declined. Labs ordered and reviewed. Risk factors for depression reviewed and negative. Hearing function and visual acuity are intact. ADLs screened and addressed as needed. Functional ability and level of safety reviewed and appropriate. Education, counseling and referrals performed based on assessed risks today. Patient provided with a copy of personalized plan for preventive services.  Overweight Continue efforts at exercise and healthy eating  Encounter for screening for other viral diseases Update Hep C  Prostate cancer screening Update PSA; denies sx  Skin cancer screening Referral placed  Patient Counseling: '[x]'$   Nutrition: Stressed importance of moderation in sodium/caffeine intake, saturated fat and cholesterol, caloric balance, sufficient intake of fresh fruits, vegetables, and fiber.  '[x]'$   Stressed the importance of regular exercise.   '[]'$   Substance Abuse: Discussed cessation/primary prevention of tobacco, alcohol, or other drug use; driving or other dangerous activities under the influence; availability of treatment for abuse.   '[x]'$   Injury prevention: Discussed safety belts, safety helmets, smoke detector, smoking near bedding or upholstery.   '[]'$   Sexuality: Discussed sexually transmitted diseases, partner  selection, use of condoms, avoidance of unintended pregnancy  and contraceptive alternatives.   '[x]'$   Dental health: Discussed importance of regular tooth brushing, flossing, and dental visits.  '[x]'$   Health maintenance and immunizations reviewed. Please refer to Health maintenance section.     I,Savera Zaman,acting as a Education administrator for [employer], PA.,have documented all relevant documentation on the behalf of Inda Coke, PA,as directed by  Inda Coke, PA while in the presence of Inda Coke, [place].   I, Inda Coke, [place], have reviewed all documentation for this visit. The documentation on 03/05/22 for the exam, diagnosis, procedures, and orders are all accurate and complete.   Inda Coke, PA-C [place]

## 2022-03-05 NOTE — Patient Instructions (Signed)
It was great to see you!  I will place referral for dermatologist.  We will fax forms when blood work has returned for your work form.  Please go to the lab for blood work.   Our office will call you with your results unless you have chosen to receive results via MyChart.  If your blood work is normal we will follow-up each year for physicals and as scheduled for chronic medical problems.  If anything is abnormal we will treat accordingly and get you in for a follow-up.  Take care,  Aldona Bar

## 2022-03-06 LAB — HEPATITIS C ANTIBODY: Hepatitis C Ab: NONREACTIVE

## 2022-03-09 ENCOUNTER — Encounter: Payer: Self-pay | Admitting: *Deleted

## 2023-03-07 ENCOUNTER — Ambulatory Visit (INDEPENDENT_AMBULATORY_CARE_PROVIDER_SITE_OTHER): Payer: Commercial Managed Care - PPO | Admitting: Physician Assistant

## 2023-03-07 ENCOUNTER — Encounter: Payer: Self-pay | Admitting: Physician Assistant

## 2023-03-07 VITALS — BP 120/88 | HR 67 | Temp 97.5°F | Ht 74.0 in | Wt 202.4 lb

## 2023-03-07 DIAGNOSIS — W57XXXD Bitten or stung by nonvenomous insect and other nonvenomous arthropods, subsequent encounter: Secondary | ICD-10-CM

## 2023-03-07 DIAGNOSIS — Z0000 Encounter for general adult medical examination without abnormal findings: Secondary | ICD-10-CM

## 2023-03-07 DIAGNOSIS — S30861D Insect bite (nonvenomous) of abdominal wall, subsequent encounter: Secondary | ICD-10-CM

## 2023-03-07 DIAGNOSIS — Z125 Encounter for screening for malignant neoplasm of prostate: Secondary | ICD-10-CM

## 2023-03-07 DIAGNOSIS — E663 Overweight: Secondary | ICD-10-CM | POA: Diagnosis not present

## 2023-03-07 LAB — COMPREHENSIVE METABOLIC PANEL
ALT: 31 U/L (ref 0–53)
AST: 22 U/L (ref 0–37)
Albumin: 4.4 g/dL (ref 3.5–5.2)
Alkaline Phosphatase: 56 U/L (ref 39–117)
BUN: 12 mg/dL (ref 6–23)
CO2: 30 mEq/L (ref 19–32)
Calcium: 9.9 mg/dL (ref 8.4–10.5)
Chloride: 104 mEq/L (ref 96–112)
Creatinine, Ser: 0.97 mg/dL (ref 0.40–1.50)
GFR: 96.6 mL/min (ref >60.00)
Glucose, Bld: 85 mg/dL (ref 70–99)
Potassium: 4.2 mEq/L (ref 3.5–5.1)
Sodium: 140 mEq/L (ref 135–145)
Total Bilirubin: 0.7 mg/dL (ref 0.2–1.2)
Total Protein: 7.3 g/dL (ref 6.0–8.3)

## 2023-03-07 LAB — CBC WITH DIFFERENTIAL/PLATELET
Basophils Absolute: 0.1 10*3/uL (ref 0.0–0.1)
Basophils Relative: 0.9 % (ref 0.0–3.0)
Eosinophils Absolute: 0.2 10*3/uL (ref 0.0–0.7)
Eosinophils Relative: 2.5 % (ref 0.0–5.0)
HCT: 46.8 % (ref 39.0–52.0)
Hemoglobin: 15.2 g/dL (ref 13.0–17.0)
Lymphocytes Relative: 25.7 % (ref 12.0–46.0)
Lymphs Abs: 2 10*3/uL (ref 0.7–4.0)
MCHC: 32.4 g/dL (ref 30.0–36.0)
MCV: 85.3 fl (ref 78.0–100.0)
Monocytes Absolute: 0.9 10*3/uL (ref 0.1–1.0)
Monocytes Relative: 11 % (ref 3.0–12.0)
Neutro Abs: 4.7 10*3/uL (ref 1.4–7.7)
Neutrophils Relative %: 59.9 % (ref 43.0–77.0)
Platelets: 300 10*3/uL (ref 150.0–400.0)
RBC: 5.49 Mil/uL (ref 4.22–5.81)
RDW: 14 % (ref 11.5–15.5)
WBC: 7.8 10*3/uL (ref 4.0–10.5)

## 2023-03-07 LAB — LIPID PANEL
Cholesterol: 196 mg/dL (ref 0–200)
HDL: 43.6 mg/dL (ref >39.00)
LDL Cholesterol: 129 mg/dL — ABNORMAL HIGH (ref 0–99)
NonHDL: 152.54
Total CHOL/HDL Ratio: 4
Triglycerides: 117 mg/dL (ref 0.0–149.0)
VLDL: 23.4 mg/dL (ref 0.0–40.0)

## 2023-03-07 LAB — PSA: PSA: 0.72 ng/mL (ref 0.10–4.00)

## 2023-03-07 NOTE — Patient Instructions (Signed)
It was great to see you! ? ?Please go to the lab for blood work.  ? ?Our office will call you with your results unless you have chosen to receive results via MyChart. ? ?If your blood work is normal we will follow-up each year for physicals and as scheduled for chronic medical problems. ? ?If anything is abnormal we will treat accordingly and get you in for a follow-up. ? ?Take care, ? ?Samantha ?  ? ? ?

## 2023-03-07 NOTE — Progress Notes (Signed)
Subjective:    Joshua Baker is a 42 y.o. male and is here for a comprehensive physical exam.  HPI  There are no preventive care reminders to display for this patient.   Acute Concerns: Sleep/Mood: Reports he has issues sleeping when he first began his new position at work  Notes he didn't sleep well first 2 weeks, but last 6 weeks notes good sleep quality.  States that if poor sleep occurs continuously over next 6 months he will discuss further.  Reports he visits mental health counselor every 2 weeks.   Tick Bite: Reports experiencing tick bite two months ago. He believes he removed the whole tick.  Treated with antibiotics, notes area is still red and itchy. Has not put anything on area. Denies any drainage.  Chronic Issues: None.   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 Sleep habits -- When first changed position, sleep quality declined, reports last 6 weeks have been phenomenal Exercise -- Walks 2.5-3 every morning for past 6 months.   Weight --   Recent weight history Wt Readings from Last 10 Encounters:  03/07/23 202 lb 6.1 oz (91.8 kg)  03/05/22 203 lb 4 oz (92.2 kg)  07/28/21 205 lb 0.4 oz (93 kg)  03/01/21 205 lb (93 kg)  02/19/20 196 lb (88.9 kg)  02/16/19 196 lb 8 oz (89.1 kg)  02/11/18 193 lb (87.5 kg)  11/06/16 188 lb (85.3 kg)   Body mass index is 25.98 kg/m.  Mood -- New position at work has added slight stress, otherwise no concerns. Alcohol use --  reports current alcohol use of about 3.0 standard drinks of alcohol per week.  Tobacco use --  Tobacco Use: Medium Risk (03/07/2023)   Patient History    Smoking Tobacco Use: Former    Smokeless Tobacco Use: Never    Passive Exposure: Not on file    Eligible for Low Dose CT?  UTD with eye doctor? Yes UTD with  dentist? Yes     03/07/2023    9:05 AM  Depression screen PHQ 2/9  Decreased Interest 0  Down, Depressed, Hopeless 0  PHQ - 2 Score 0    Other providers/specialists: Patient Care Team: Jarold Motto, [place] as PCP - General (Physician Assistant)    PMHx, SurgHx, SocialHx, Medications, and Allergies were reviewed in the Visit Navigator and updated as appropriate.   Past Medical History:  Diagnosis Date   Allergy 03/2005     Past Surgical History:  Procedure Laterality Date   WISDOM TOOTH EXTRACTION  01/28/2020     Family History  Problem Relation Age of Onset   Other Mother        borderline personality   Prostate cancer Father    Parkinson's disease Maternal Grandfather    Colon cancer Neg Hx     Social History   Tobacco Use   Smoking status: Former    Types: Cigars   Smokeless tobacco: Never   Tobacco comments:    rarely  Vaping Use   Vaping status: Never Used  Substance Use Topics   Alcohol use: Yes    Alcohol/week: 3.0 standard drinks of alcohol    Types: 3 Cans of beer per week   Drug use: No    Review of Systems:   ROS  Objective:  Vitals:   03/07/23 0901 03/07/23 0920  BP: (!) 130/90 120/88  Pulse: 67   Temp: (!) 97.5 F (36.4 C)   SpO2: 96%     Body mass index is 25.98 kg/m.  General  Alert, cooperative, no distress, appears stated age  Head:  Normocephalic, without obvious abnormality, atraumatic  Eyes:  PERRL, conjunctiva/corneas clear, EOM's intact, fundi benign, both eyes       Ears:  Normal TM's and external ear canals, both ears  Nose: Nares normal, septum midline, mucosa normal, no drainage or sinus tenderness  Throat: Lips, mucosa, and tongue normal; teeth and gums normal  Neck: Supple, symmetrical, trachea midline, no adenopathy;     thyroid:  No enlargement/tenderness/nodules; no carotid bruit or JVD  Back:   Symmetric, no curvature, ROM normal, no CVA tenderness  Lungs:   Clear to auscultation bilaterally,  respirations unlabored  Chest wall:  No tenderness or deformity  Heart:  Regular rate and rhythm, S1 and S2 normal, no murmur, rub or gallop  Abdomen:   Soft, non-tender, bowel sounds active all four quadrants, no masses, no organomegaly  Extremities: Extremities normal, atraumatic, no cyanosis or edema  Prostate : Deferred   Skin: Skin color, texture, turgor normal Approximately 3 mm raised erythematous papule with central pinpoint eschar  Lymph nodes: Cervical, supraclavicular, and axillary nodes normal  Neurologic: CNII-XII grossly intact. Normal strength, sensation and reflexes throughout   AssessmentPlan:   Routine physical examination Today patient counseled on age appropriate routine health concerns for screening and prevention, each reviewed and up to date or declined. Immunizations reviewed and up to date or declined. Labs ordered and reviewed. Risk factors for depression reviewed and negative. Hearing function and visual acuity are intact. ADLs screened and addressed as needed. Functional ability and level of safety reviewed and appropriate. Education, counseling and referrals performed based on assessed risks today. Patient provided with a copy of personalized plan for preventive services.  Overweight Continue efforts at healthy lifestyle  Prostate cancer screening Update PSA  Tick bite of abdomen, subsequent encounter Appears to be healing well Recommend topical hydrocortisone cream Follow-up if new/worsening symptom(s)     I,Emily Lagle,acting as a scribe for [employer], PA.,have documented all relevant documentation on the behalf of Jarold Motto, PA,as directed by  Jarold Motto, PA while in the presence of Jarold Motto, [place].  I, Jarold Motto, [place], have reviewed all documentation for this visit. The documentation on 03/07/23 for the exam, diagnosis, procedures, and orders are all accurate and complete.  Jarold Motto, PA-C [facility]

## 2024-03-09 ENCOUNTER — Ambulatory Visit: Payer: Commercial Managed Care - PPO | Admitting: Physician Assistant

## 2024-03-24 ENCOUNTER — Ambulatory Visit: Admitting: Physician Assistant

## 2024-04-08 ENCOUNTER — Ambulatory Visit: Admitting: Internal Medicine

## 2024-04-08 ENCOUNTER — Encounter: Payer: Self-pay | Admitting: Internal Medicine

## 2024-04-08 VITALS — BP 120/78 | HR 68 | Temp 98.2°F | Ht 74.0 in | Wt 211.4 lb

## 2024-04-08 DIAGNOSIS — H7401 Tympanosclerosis, right ear: Secondary | ICD-10-CM

## 2024-04-08 DIAGNOSIS — E785 Hyperlipidemia, unspecified: Secondary | ICD-10-CM | POA: Diagnosis not present

## 2024-04-08 DIAGNOSIS — Z0001 Encounter for general adult medical examination with abnormal findings: Secondary | ICD-10-CM

## 2024-04-08 DIAGNOSIS — L989 Disorder of the skin and subcutaneous tissue, unspecified: Secondary | ICD-10-CM

## 2024-04-08 DIAGNOSIS — E663 Overweight: Secondary | ICD-10-CM | POA: Diagnosis not present

## 2024-04-08 LAB — CBC WITH DIFFERENTIAL/PLATELET
Basophils Absolute: 0.1 K/uL (ref 0.0–0.1)
Basophils Relative: 0.8 % (ref 0.0–3.0)
Eosinophils Absolute: 0.2 K/uL (ref 0.0–0.7)
Eosinophils Relative: 2 % (ref 0.0–5.0)
HCT: 47.4 % (ref 39.0–52.0)
Hemoglobin: 15.7 g/dL (ref 13.0–17.0)
Lymphocytes Relative: 28.6 % (ref 12.0–46.0)
Lymphs Abs: 2.1 K/uL (ref 0.7–4.0)
MCHC: 33.2 g/dL (ref 30.0–36.0)
MCV: 83.6 fl (ref 78.0–100.0)
Monocytes Absolute: 0.7 K/uL (ref 0.1–1.0)
Monocytes Relative: 9.5 % (ref 3.0–12.0)
Neutro Abs: 4.4 K/uL (ref 1.4–7.7)
Neutrophils Relative %: 59.1 % (ref 43.0–77.0)
Platelets: 280 K/uL (ref 150.0–400.0)
RBC: 5.67 Mil/uL (ref 4.22–5.81)
RDW: 13.7 % (ref 11.5–15.5)
WBC: 7.4 K/uL (ref 4.0–10.5)

## 2024-04-08 LAB — COMPREHENSIVE METABOLIC PANEL WITH GFR
ALT: 34 U/L (ref 0–53)
AST: 21 U/L (ref 0–37)
Albumin: 4.4 g/dL (ref 3.5–5.2)
Alkaline Phosphatase: 58 U/L (ref 39–117)
BUN: 12 mg/dL (ref 6–23)
CO2: 29 meq/L (ref 19–32)
Calcium: 9.5 mg/dL (ref 8.4–10.5)
Chloride: 103 meq/L (ref 96–112)
Creatinine, Ser: 0.95 mg/dL (ref 0.40–1.50)
GFR: 98.29 mL/min (ref >60.00)
Glucose, Bld: 78 mg/dL (ref 70–99)
Potassium: 4.3 meq/L (ref 3.5–5.1)
Sodium: 140 meq/L (ref 135–145)
Total Bilirubin: 0.8 mg/dL (ref 0.2–1.2)
Total Protein: 7.4 g/dL (ref 6.0–8.3)

## 2024-04-08 LAB — LIPID PANEL
Cholesterol: 205 mg/dL — ABNORMAL HIGH (ref 0–200)
HDL: 38.7 mg/dL — ABNORMAL LOW (ref >39.00)
LDL Cholesterol: 141 mg/dL — ABNORMAL HIGH (ref 0–99)
NonHDL: 166.18
Total CHOL/HDL Ratio: 5
Triglycerides: 124 mg/dL (ref 0.0–149.0)
VLDL: 24.8 mg/dL (ref 0.0–40.0)

## 2024-04-08 NOTE — Progress Notes (Signed)
 [facility] at [facility] [address] [place], [place] [address] Office:  [phone]  -- Annual Preventive Medical Office Visit --  Patient:  Joshua Baker      Age: 43 y.o.       Sex:  male  Date:   04/08/2024 Patient Care Team: Job Lukes, [place] as PCP - General (Physician Assistant) Today's Healthcare Provider: Bernardino KANDICE Cone, MD  ========================================= Chief complaint: Annual Exam (Pt is present for cpe pt )  Purpose of Visit: Comprehensive preventive health assessment and personalized health maintenance planning.  This encounter was conducted as a Comprehensive Physical Exam (CPE) preventive care annual visit. The patient's medical history and problem list were reviewed to inform individualized preventive care recommendations.   No problem-specific medical treatment was provided during this visit.  Assessment & Plan Encounter for annual general medical examination with abnormal findings in adult An annual wellness exam was conducted with no acute concerns. Emphasized the importance of maintaining a healthy lifestyle, including diet and exercise, to prevent future health issues. Discussed potential changes in healthcare insurance structures and the importance of understanding coverage for tests and procedures. Overweight Mildly overweight by BMI. Current exercise routine includes walking 2-3 miles daily. Advised to incorporate more cardiovascular exercise to meet the recommended 150 minutes per week. Discussed the importance of avoiding high-impact activities to protect joints. Focus on dietary changes to manage weight. Dyslipidemia (high LDL; low HDL) Chronic dyslipidemia with LDL levels ranging from 110 to 130 over the past six years. No family history of heart problems. Current risk of heart problems is low at 1.7%, not warranting cholesterol medication at this time. Emphasized dietary changes as a model for his children. Eliminate trans  fats from diet, include omega-3 and omega-9 fatty acids, and monitor cholesterol levels regularly. Tympanosclerosis, right Mild scarring of the right eardrum likely due to past infections, possibly from childhood. Skin lesion Growing melanocytic lesion of abdomen   A slowly growing melanocytic lesion on the abdomen, appearing as a papule. Advised to follow up with a dermatologist for further evaluation. Photographs Taken 04/08/2024 :     Lab Results  Component Value Date   CHOL 196 03/07/2023   CHOL 182 03/05/2022   HDL 43.60 03/07/2023   HDL 35.80 (L) 03/05/2022   CHOLHDL 4 03/07/2023   CHOLHDL 5 03/05/2022   LDLCALC 129 (H) 03/07/2023   LDLCALC 113 (H) 03/05/2022   TRIG 117.0 03/07/2023   TRIG 169.0 (H) 03/05/2022   VLDL 23.4 03/07/2023   VLDL 33.8 03/05/2022   The [id number] ASCVD risk score (Arnett DK, et al., 2019) is: 1.7%   Values used to calculate the score:     Age: 84 years     Clincally relevant sex: Male     Is Non-Hispanic African American: No     Diabetic: No     Tobacco smoker: No     Systolic Blood Pressure: 120 mmHg     Is BP treated: No     HDL Cholesterol: 43.6 mg/dL     Total Cholesterol: 196 mg/dL  Lab Results  Component Value Date   LDLCALC 129 (H) 03/07/2023   LDLCALC 113 (H) 03/05/2022   LDLCALC 119 (H) 03/01/2021   LDLCALC 119 (H) 02/19/2020   LDLCALC 110 (H) 02/16/2019   LDLCALC 117 (H) 02/11/2018   LDLCALC 107 (H) 11/06/2016   No diagnosis found.   Reviewed/updated/encouraged completion: Immunization History  Administered Date(s) Administered   Influenza-Unspecified 05/30/2022   PFIZER(Purple Top)SARS-COV-2 Vaccination 10/03/2019,  10/24/2019, 07/05/2020   Tdap 11/06/2016   Health Maintenance Due  Topic Date Due   Hepatitis B Vaccines 19-59 Average Risk (1 of 3 - 19+ 3-dose series) Never done   HPV VACCINES (1 - 3-dose SCDM series) Never done   INFLUENZA VACCINE  03/06/2024   Health Maintenance  Topic Date Due   Hepatitis B  Vaccines 19-59 Average Risk (1 of 3 - 19+ 3-dose series) Never done   HPV VACCINES (1 - 3-dose SCDM series) Never done   INFLUENZA VACCINE  03/06/2024   COVID-19 Vaccine (4 - 2025-26 season) 04/24/2024 (Originally 04/06/2024)   DTaP/Tdap/Td (2 - Td or Tdap) 11/07/2026   Hepatitis C Screening  Completed   HIV Screening  Completed   Pneumococcal Vaccine  Aged Out   Meningococcal B Vaccine  Aged Out    Reviewed the following verbally with patient and provided AVS materials:   HEALTH MAINTENANCE COUNSELING AND ANTICIPATORY GUIDANCE    Preventive Measure Recommendation  Eye Exams Every 1-2 years  Dental Care Cleanings every 6 months or more, brush/floss 3x daily  Sinus Care Saline spray rinses daily  Sleep 8 hours nightly, good sleep hygiene, e-monitoring if any daytime drowsiness  Diet Fruits/vegetables/fiber/healthy fats, balance and moderation  Exercise 150 minutes weekly  Risk Behaviors Discouraged any/all high risk behaviors    CANCER SCREENING SHARED DECISION MAKING    Penile/Testicle/Scrotum Encouraged self-monitoring and reporting of genital abnormalities. Patient reports none.  Thyroid Checked and advised to palpate thyroid for nodules  Prostate Individualized risks/benefits/costs discussed Lab Results  Component Value Date   PSA 0.72 03/07/2023   PSA 0.55 03/05/2022    Colon HM Colonoscopy   This patient has no relevant Health Maintenance data.     Lung Current guidelines recommend individuals aged 12 to 15 who currently smoke or formerly smoked and have a >= 20 pack-year smoking history should undergo annual screening with low-dose computed tomography (LDCT). Tobacco Use: Medium Risk (04/08/2024)   Patient History    Smoking Tobacco Use: Former    Smokeless Tobacco Use: Never    Passive Exposure: Not on file   Social History   Tobacco Use  Smoking Status Former   Types: Cigars  Smokeless Tobacco Never  Tobacco Comments   rarely    Skin Advised regular  sunscreen use. Patient denies worrisome, changing, or new skin lesions. Offered to include images in chart for surveillance. Showed patient these pictures of melanomas for reference to educate for self-monitoring. He showed lesion as shown in assessment / plan : Growing melanocytic lesion of abdomen   A slowly growing melanocytic lesion on the abdomen, appearing as a papule. Advised to follow up with a dermatologist for further evaluation.  Other Cancers Discussed lack of screening guidelines and insurance coverage for other cancer types.    Discussed the use of AI scribe software for clinical note transcription with the patient, who gave verbal consent to proceed.  History of Present Illness Shalamar Crays is a 44 year old male who presents for an annual physical exam to maintain his medical insurance rate and track health data.  He has been attending annual physical exams for the past four to five years, motivated by the requirement to maintain his medical insurance rate and his interest in tracking health data over time. He is particularly focused on monitoring his cholesterol levels, specifically LDL, which has remained between 110 to 130 mg/dL over the past six years.  He has made dietary changes since his wife completed grad  school a year ago, reducing fast food intake to about once or twice a month. He is focusing on avoiding trans fats and increasing intake of omega-3 and omega-9 fatty acids.  He walks two to three miles every morning and engages in more intense cardio exercise once a week.  He has a history of mild scarring on his right eardrum. He brushes his teeth three times a day, drinks coffee, and does not smoke. He has no family history of colon cancer and has never had blood in his stool.  He has a slowly growing lesion on his abdomen, which he plans to have evaluated by a dermatologist. He does not take any regular medications, only occasional allergy and pain medications as  needed.  He has a family history of cancer in the lower region, as his father had a related condition, prompting him to have PSA tests every couple of years. He used to smoke cigars rarely, about once every two years.    ROS A comprehensive ROS was negative for any concerning symptoms.   Completed medication reconciliation: Current Outpatient Medications on File Prior to Visit  Medication Sig   acetaminophen (TYLENOL) 325 MG tablet Take 650 mg by mouth as needed.   cetirizine (ZYRTEC) 10 MG tablet Take 10 mg by mouth daily.   fluticasone  (FLONASE  ALLERGY RELIEF) 50 MCG/ACT nasal spray Place 1 spray into both nostrils daily.   ibuprofen (ADVIL) 200 MG tablet Take 400 mg by mouth as needed.   No current facility-administered medications on file prior to visit.  There are no discontinued medications.The following were reviewed and/or entered/updated into our electronic MEDICAL RECORD NUMBERPast Medical History:  Diagnosis Date   Allergy 03/2005   Past Surgical History:  Procedure Laterality Date   WISDOM TOOTH EXTRACTION  01/28/2020   Social History   Socioeconomic History   Marital status: Married    Spouse name: Not on file   Number of children: Not on file   Years of education: Not on file   Highest education level: Not on file  Occupational History   Not on file  Tobacco Use   Smoking status: Former    Types: Cigars   Smokeless tobacco: Never   Tobacco comments:    rarely  Vaping Use   Vaping status: Never Used  Substance and Sexual Activity   Alcohol use: Yes    Alcohol/week: 3.0 standard drinks of alcohol    Types: 3 Cans of beer per week   Drug use: No   Sexual activity: Yes    Birth control/protection: Surgical  Other Topics Concern   Not on file  Social History Narrative   Teaches at [employer], Interior and spatial designer of operations   Live in [place]   Married   3 children   Play guitar   Social Drivers of Corporate investment banker Strain: Not on file  Food  Insecurity: Not on file  Transportation Needs: Not on file  Physical Activity: Not on file  Stress: Not on file  Social Connections: Not on file  Intimate Partner Violence: Not on file       No data to display         Family History  Problem Relation Age of Onset   Other Mother        borderline personality   Prostate cancer Father    Parkinson's disease Maternal Grandfather    Colon cancer Neg Hx    Allergies  Allergen Reactions   Bee Venom Swelling  Any Stinging insects, severe local swelling   Pollen Extract    Social History   Substance and Sexual Activity  Sexual Activity Yes   Birth control/protection: Surgical  @    04/08/2024    8:15 AM  Depression screen PHQ 2/9  Decreased Interest 0  Down, Depressed, Hopeless 0  PHQ - 2 Score 0      04/08/2024    8:15 AM  Fall Risk   Falls in the past year? 0  Number falls in past yr: 0  Injury with Fall? 0  Risk for fall due to : No Fall Risks  Follow up Falls evaluation completed     BP 120/78   Pulse 68   Temp 98.2 F (36.8 C) (Temporal)   Ht 6' 2 (1.88 m)   Wt 211 lb 6.4 oz (95.9 kg)   SpO2 98%   BMI 27.14 kg/m  BP Readings from Last 3 Encounters:  04/08/24 120/78  03/07/23 120/88  03/05/22 110/72   Wt Readings from Last 10 Encounters:  04/08/24 211 lb 6.4 oz (95.9 kg)  03/07/23 202 lb 6.1 oz (91.8 kg)  03/05/22 203 lb 4 oz (92.2 kg)  07/28/21 205 lb 0.4 oz (93 kg)  03/01/21 205 lb (93 kg)  02/19/20 196 lb (88.9 kg)  02/16/19 196 lb 8 oz (89.1 kg)  02/11/18 193 lb (87.5 kg)  11/06/16 188 lb (85.3 kg)  Physical Exam  acute distress, resting comfortably. HEENT:  Mild scarring on right eardrumly, oropharynx clear, no thyromegaly noted, no palpable lymphadenopathy or thyroid nodules. CARDIOVASCULAR: S1 and S2 heart sounds with regular rate and rhythm, no murmurs appreciated. PULMONARY: Normal work of breathing, clear to auscultation bilaterally, no crackles, wheezes, or rhonchi. ABDOMEN: Soft,  nontender, nondistended. MSK: No edema, cyanosis, or clubbing noted. SKIN: Warm, dry, Slowly growing melanocytic lesion on abdomen. NEUROLOGICAL: Cranial nerves II-XII grossly intact, strength 5/5 in upper and lower extremities, reflexes symmetric and intact bilaterally. PSYCH: Normal affect and thought content, pleasant and cooperative.  Last CBC Lab Results  Component Value Date   WBC 7.8 03/07/2023   HGB 15.2 03/07/2023   HCT 46.8 03/07/2023   MCV 85.3 03/07/2023   RDW 14.0 03/07/2023   PLT 300.0 03/07/2023   Last metabolic panel Lab Results  Component Value Date   GLUCOSE 85 03/07/2023   NA 140 03/07/2023   K 4.2 03/07/2023   CL 104 03/07/2023   CO2 30 03/07/2023   BUN 12 03/07/2023   CREATININE 0.97 03/07/2023   GFR 96.60 03/07/2023   CALCIUM 9.9 03/07/2023   PROT 7.3 03/07/2023   ALBUMIN 4.4 03/07/2023   BILITOT 0.7 03/07/2023   ALKPHOS 56 03/07/2023   AST 22 03/07/2023   ALT 31 03/07/2023   Last lipids Lab Results  Component Value Date   CHOL 196 03/07/2023   HDL 43.60 03/07/2023   LDLCALC 129 (H) 03/07/2023   TRIG 117.0 03/07/2023   CHOLHDL 4 03/07/2023   Last hemoglobin A1c No results found for: HGBA1C Last thyroid functions No results found for: TSH, T3TOTAL, T4TOTAL, THYROIDAB Last vitamin D No results found for: 25OHVITD2, 25OHVITD3, VD25OH Last vitamin B12 and Folate No results found for: VITAMINB12, FOLATE      ======================================  IMPORTANT HEALTH REMINDERS: Report any new or changing skin lesions promptly Maintain recommended screening schedules Discuss any new family history of cancer at future visits Follow up on any new symptoms that persist more than two weeks      Notes:  This document  was synthesized by artificial intelligence ([employer]bridge) using HIPAA-compliant recording of the clinical interaction;   We discussed the use of AI scribe software for clinical note transcription with the patient,  who gave verbal consent to proceed.    This encounter employed state-of-the-art, real-time, collaborative documentation. The patient was empowered to actively review and assist in updating their electronic medical record on a shared monitor, ensuring transparency and improving accuracy.    Prior to and at the beginning of Comprehensive Physical Exam (CPE) preventive care annual visit appointment types  we clarify to patients Our goal today is to focus on your preventive or annual Comprehensive Physical Exam (CPE) preventive care annual visit, which typically covers routine screenings and overall health maintenance. However, if you share any new or concerning symptoms--such as dizziness, passing out, severe pain, or anything else that may point to a more serious issue--we are both legally and ethically required to evaluate it. We cannot simply overlook or ignore such concerns, even if you later decide you don't want to discuss them, because it could jeopardize your health.  If addressing a new concern takes us  beyond the scope of the preventive visit, we may need to bill separately for that portion of care. We understand financial considerations are important, and we're happy to discuss your options if something new comes up. However, we want to be clear that once you mention a potentially serious issue, we must investigate it; we can't ethically or legally exclude that from our records or our evaluation. Please let us  know all of your questions or worries. Together, we can decide how best to manage them and how to minimize any unexpected costs, but we want to keep you safe above all else.   This disclosure is mandated by professional ethics and legal obligations, as healthcare providers must address any substantial health concerns raised during any patient interaction and a comprehensive ROS is required by insurance companies for billing preventive-care visit type.   This disclosure ultimately discourages  patients financially from reporting significant health issues.   Medical Screening Exam A medical screening exam (MSE) helps to determine whether you need immediate medical treatment relating to any number of symptoms you are having. This type of exam may be done in an emergency department, an urgent care setting, or your health care provider's office. Depending on your symptoms and severity, you may need additional tests or medical therapy. It is important to note that an MSE does not necessarily mean that you will need or receive further medical testing or interventions if your symptoms are not deemed to be medically urgent (emergent). Tell a health care provider about: Any allergies you have. All medicines you are taking, including vitamins, herbs, eye drops, creams, and over-the-counter medicines. Any problems you or family members have had with anesthetic medicines. Any bleeding problems you have. Any surgeries you have had. Any medical conditions you have. Whether you are pregnant or may be pregnant. What happens during the test? During the exam, a health care provider does a short, often focused, physical exam and asks about your medical history to assess: Your current symptoms. Your overall health. Your need for possible further medical intervention. What can I expect after the test? If you have a regular health care provider, make an appointment for a follow-up visit with him or her. If you do not have a regular health care provider, ask about resources in your community. Your medical screening exam may determine that: You do not need emergency treatment at  this time. You need treatment right away. You need to be transferred to another medical center. This may happen if you need an emergent specialist or consultant that is not available at the medical center you are at. You need to have more tests. A medical specialist may be consulted if needed. Get help right away if: Your  condition gets worse. You develop new or troubling symptoms before you see your health care provider. These symptoms may represent a serious problem that is an emergency. Do not wait to see if the symptoms will go away. Get medical help right away. Call your local emergency services (911 in the [place] Do not drive yourself to the hospital. Summary A medical screening exam helps to determine whether you need medical treatment right away. This type of exam may be done in an emergency department, an urgent care setting, or your health care provider's office. During the exam, a health care provider does a short physical exam and asks about your current symptoms and overall health. Depending on the exam, more tests or therapies may be ordered. However, an MSE does not necessarily mean that you will have further medical testing if your symptoms are not deemed to be urgent. If you need further care that is not offered at your current medical center, you may need to be transferred to another facility. This information is not intended to replace advice given to you by your health care provider. Make sure you discuss any questions you have with your health care provider. Document Revised: 04/05/2021 Document Reviewed: 12/01/2020 Elsevier Patient Education  2024 Elsevier Inc.   Health Maintenance, Male Adopting a healthy lifestyle and getting preventive care are important in promoting health and wellness. Ask your health care provider about: The right schedule for you to have regular tests and exams. Things you can do on your own to prevent diseases and keep yourself healthy. What should I know about diet, weight, and exercise? Eat a healthy diet  Eat a diet that includes plenty of vegetables, fruits, low-fat dairy products, and lean protein. Do not eat a lot of foods that are high in solid fats, added sugars, or sodium. Maintain a healthy weight Body mass index (BMI) is a measurement that can be used  to identify possible weight problems. It estimates body fat based on height and weight. Your health care provider can help determine your BMI and help you achieve or maintain a healthy weight. Get regular exercise Get regular exercise. This is one of the most important things you can do for your health. Most adults should: Exercise for at least 150 minutes each week. The exercise should increase your heart rate and make you sweat (moderate-intensity exercise). Do strengthening exercises at least twice a week. This is in addition to the moderate-intensity exercise. Spend less time sitting. Even light physical activity can be beneficial. Watch cholesterol and blood lipids Have your blood tested for lipids and cholesterol at 43 years of age, then have this test every 5 years. You may need to have your cholesterol levels checked more often if: Your lipid or cholesterol levels are high. You are older than 43 years of age. You are at high risk for heart disease. What should I know about cancer screening? Many types of cancers can be detected early and may often be prevented. Depending on your health history and family history, you may need to have cancer screening at various ages. This may include screening for: Colorectal cancer. Prostate cancer. Skin cancer. Lung  cancer. What should I know about heart disease, diabetes, and high blood pressure? Blood pressure and heart disease High blood pressure causes heart disease and increases the risk of stroke. This is more likely to develop in people who have high blood pressure readings or are overweight. Talk with your health care provider about your target blood pressure readings. Have your blood pressure checked: Every 3-5 years if you are 65-85 years of age. Every year if you are 23 years old or older. If you are between the ages of 60 and 38 and are a current or former smoker, ask your health care provider if you should have a one-time screening for  abdominal aortic aneurysm (AAA). Diabetes Have regular diabetes screenings. This checks your fasting blood sugar level. Have the screening done: Once every three years after age 44 if you are at a normal weight and have a low risk for diabetes. More often and at a younger age if you are overweight or have a high risk for diabetes. What should I know about preventing infection? Hepatitis B If you have a higher risk for hepatitis B, you should be screened for this virus. Talk with your health care provider to find out if you are at risk for hepatitis B infection. Hepatitis C Blood testing is recommended for: Everyone born from [birth year] through 1965. Anyone with known risk factors for hepatitis C. Sexually transmitted infections (STIs) You should be screened each year for STIs, including gonorrhea and chlamydia, if: You are sexually active and are younger than 43 years of age. You are older than 43 years of age and your health care provider tells you that you are at risk for this type of infection. Your sexual activity has changed since you were last screened, and you are at increased risk for chlamydia or gonorrhea. Ask your health care provider if you are at risk. Ask your health care provider about whether you are at high risk for HIV. Your health care provider may recommend a prescription medicine to help prevent HIV infection. If you choose to take medicine to prevent HIV, you should first get tested for HIV. You should then be tested every 3 months for as long as you are taking the medicine. Follow these instructions at home: Alcohol use Do not drink alcohol if your health care provider tells you not to drink. If you drink alcohol: Limit how much you have to 0-2 drinks a day. Know how much alcohol is in your drink. In the [place] one drink equals one 12 oz bottle of beer (355 mL), one 5 oz glass of wine (148 mL), or one 1 oz glass of hard liquor (44 mL). Lifestyle Do not use any products that  contain nicotine or tobacco. These products include cigarettes, chewing tobacco, and vaping devices, such as e-cigarettes. If you need help quitting, ask your health care provider. Do not use street drugs. Do not share needles. Ask your health care provider for help if you need support or information about quitting drugs. General instructions Schedule regular health, dental, and eye exams. Stay current with your vaccines. Tell your health care provider if: You often feel depressed. You have ever been abused or do not feel safe at home. Summary Adopting a healthy lifestyle and getting preventive care are important in promoting health and wellness. Follow your health care provider's instructions about healthy diet, exercising, and getting tested or screened for diseases. Follow your health care provider's instructions on monitoring your cholesterol and blood pressure. This  information is not intended to replace advice given to you by your health care provider. Make sure you discuss any questions you have with your health care provider. Document Revised: 12/12/2020 Document Reviewed: 12/12/2020 Elsevier Patient Education  2024 [employer].

## 2024-04-08 NOTE — Assessment & Plan Note (Signed)
 Mildly overweight by BMI. Current exercise routine includes walking 2-3 miles daily. Advised to incorporate more cardiovascular exercise to meet the recommended 150 minutes per week. Discussed the importance of avoiding high-impact activities to protect joints. Focus on dietary changes to manage weight.

## 2024-04-08 NOTE — Patient Instructions (Signed)
  [email or web address] [facility] [address] [place], [place] [address] Phone:[phone]    ?? Trans Fats: What You Need to Know (and How to Avoid Them) Protect Your Heart, Brain, and Overall Health  ? What Are Trans Fats? Trans fats are a type of unhealthy fat that can increase your risk of: Heart disease Stroke Type 2 diabetes Inflammation Memory problems They are artificially made through a process called hydrogenation and were once common in processed foods for better shelf life and texture.  ?? Why Should I Avoid Trans Fats? Even small amounts of trans fats can: Raise "bad" LDL cholesterol Lower "good" HDL cholesterol Cause inflammation in your blood vessels Increase your risk of heart attack or stroke There is no safe level of artificial trans fat.  ?? How to Spot Trans Fats (Even When the Label Says "0g") Food companies can legally say "0 grams trans fat" if the product contains less than 0.5 grams per serving -- but that can add up fast! Look at the ingredients list for these clues: ?? Partially hydrogenated oil ? this means trans fat is present. ? Avoid foods with "shortening" or "hydrogenated" oils.  ?? Common Foods That May Contain Trans Fats Even today, you may find trans fats in: Baked goods (cookies, cakes, pies) Microwave popcorn Crackers Margarine and shortening Fried fast foods Frozen pizza  ? Healthier Choices Choose products with 0g trans fat and no "partially hydrogenated oil" in the ingredients. Use olive oil, avocado oil, or canola oil for cooking. Eat more whole, unprocessed foods: fruits, vegetables, whole grains, and lean proteins. Choose baked over fried, and fresh over packaged.  ?? Takeaway Message Trans fats are harmful, even in small amounts. To protect your health: Read labels carefully. Look beyond "0g trans fat" and scan for "partially hydrogenated  oils." Choose whole foods and heart-healthy fats.

## 2024-04-09 LAB — TSH RFX ON ABNORMAL TO FREE T4: TSH: 1.17 u[IU]/mL (ref 0.450–4.500)

## 2024-04-12 ENCOUNTER — Ambulatory Visit: Payer: Self-pay | Admitting: Internal Medicine

## 2024-04-12 NOTE — Progress Notes (Signed)
 Reviewed, all essentially normal except cholesterol.  Message shared to patient and Primary Care Provider (PCP).

## 2025-04-12 ENCOUNTER — Encounter: Admitting: Physician Assistant

## 2025-04-13 ENCOUNTER — Encounter: Admitting: Physician Assistant
